# Patient Record
Sex: Female | Born: 1960 | Race: White | Hispanic: No | Marital: Married | Smoking: Current every day smoker
Health system: Southern US, Community
[De-identification: ages and names within clinical notes are randomized; demographics above are authoritative.]

## PROBLEM LIST (undated history)

## (undated) DIAGNOSIS — E785 Hyperlipidemia, unspecified: Secondary | ICD-10-CM

---

## 2004-10-10 ENCOUNTER — Emergency Department (HOSPITAL_COMMUNITY): Admission: EM | Admit: 2004-10-10 | Discharge: 2004-10-10 | Payer: Self-pay | Admitting: Family Medicine

## 2004-10-11 ENCOUNTER — Emergency Department (HOSPITAL_COMMUNITY): Admission: EM | Admit: 2004-10-11 | Discharge: 2004-10-11 | Payer: Self-pay | Admitting: Family Medicine

## 2004-10-13 ENCOUNTER — Ambulatory Visit: Payer: Self-pay | Admitting: Internal Medicine

## 2004-10-14 ENCOUNTER — Ambulatory Visit: Payer: Self-pay | Admitting: Cardiology

## 2007-01-10 ENCOUNTER — Observation Stay (HOSPITAL_COMMUNITY): Admission: EM | Admit: 2007-01-10 | Discharge: 2007-01-12 | Payer: Self-pay | Admitting: Emergency Medicine

## 2007-01-10 ENCOUNTER — Ambulatory Visit: Payer: Self-pay | Admitting: Internal Medicine

## 2008-06-30 ENCOUNTER — Emergency Department (HOSPITAL_COMMUNITY): Admission: EM | Admit: 2008-06-30 | Discharge: 2008-06-30 | Payer: Self-pay | Admitting: Emergency Medicine

## 2010-06-19 ENCOUNTER — Encounter: Payer: Self-pay | Admitting: Cardiology

## 2010-10-15 NOTE — Discharge Summary (Signed)
[name], [name]                 ACCOUNT NO.:  [id number]   MEDICAL RECORD NO.:  [id number]          PATIENT TYPE:  INP   LOCATION:  3705                         FACILITY:  [facility]   PHYSICIAN:  Hollace Hayward, M.D.   DATE OF BIRTH:  1961-01-06   DATE OF ADMISSION:  01/10/2007  DATE OF DISCHARGE:  01/12/2007                               DISCHARGE SUMMARY   DISCHARGE DIAGNOSES:  1. Atypical chest.  2. Dyslipidemia.   DISCHARGE MEDICATIONS:  Simcor which is Zocor and Niaspan and was  instructed to a 325 mg aspirin 1 hour prior to this medication, which  she was to take it each night.   DISPOSITION:  Home with self care.   PROCEDURE:  Patient received a Myoview stress test while inpatient.   CONSULTATIONS:  Patient saw Dr. Sharyn Lull of cardiology while she was an  inpatient and will plan to follow up with him as an outpatient.   HISTORY OF PRESENT ILLNESS:  [name] is a 50 year old woman with an  intermittent history of chest pain, who awoke this morning with pain,  sweats, shortness of breath which waxes and wanes.  It was less severe  in the morning and did abate some, but never completely resolved.  The  patient radiated to the scapula but not to the jaw or arm.  There is no  association with exertion or rest.  [name] has had similar pain  every once in a while, but has never had it as intense as it was  today.  She describes the pain as sharp and when it improves it is dull  and achy.  [name] awakens at night with night sweats regularly.  Of  note, she is status post hysterectomy without oophorectomy.  She has no  other significant past medical history, though she does not see doctor  regularly.  She indicates that she is under great stress at work and  wonders if this could be causing her chest pain.   PHYSICAL EXAMINATION:  VITAL SIGNS ON ADMISSION:  Temperature 98.0,  blood pressure 129/71, pulse 74-96, respirations 14-18, O2 saturation  99% on room air.  Weight  per patient report is 150 lb.  GENERAL:  In no acute distress.  HEENT:  Eyes - pupils, equal, round, reactive to light and  accommodation.  Extraocular movements intact.  ENT - uvula midline, poor  dentition.  NECK:  Supple.  No JVD.  RESPIRATIONS:  Clear to auscultation bilaterally without wheezes, rales  or crackles.  CV:  Regular rate and rhythm.  S1 and S2 heard with physiologic  splitting.  No murmurs, rubs or gallops.  GI:  Soft, nontender, no organomegaly.  EXTREMITIES:  No cyanosis, clubbing or edema.  GU:  Deferred.  SKIN:  No rashes or lesions noted.  LYMPHATICS:  No cervical, supraclavicular, or submandibular  lymphadenopathy.  MUSCULOSKELETAL:  No muscular atrophy.  NEURO:  Alert and oriented x3.  Cranial nerves II-XII intact.  PSYCH:  Affect appropriate.  Appears somewhat anxious.   LABORATORY ON ADMISSION:  Revealed a negative D-dimer, myoglobin 52, CK-  MB of  less than 1.  Troponin less than 0.05.  Her chemistries were 141,  3.7, chloride 108, CO2 22, BUN 12, creatinine 0.8 and glucose 94.   HOSPITAL COURSE:  [name] remained stable throughout the course of  her hospitalization, though she did continue to complain of some  intermittent chest pain, as well as intermittent back pain and headache.  She was seen by Dr. Sharyn Lull, her husband's cardiology, while an  inpatient and he was concerned of possible ischemia.  Therefore, she  underwent cardiac Myoview testing on Friday, August 15.  This showed a  normal ejection fraction of around 60% and no evidence of ischemia,  either past or present.  At that point, it was decided that she would be  able to go home.  However, a lipid panel had revealed dyslipidemia with  a low HDL and elevated LDL, so it was decided that she should be started  on Simcor, which is a combination drug of a statin and Niaspan, in order  to try to correct the dyslipidemia.   LABORATORY ON DISCHARGE:  She had no labs the day of discharge.   Previous labs as noted previously revealed an elevated LDL and low HDL.  Cardiac enzymes were negative times 3.   Her vital signs on the day of discharge are as follows:  T. max of 98.1.  Pulse ranged from 64-80.  Respirations were 18.  Systolic blood pressure  was from 87-105, diastolic from 54-66.  She saturated 96% on room air.   CONDITION:  Good.   FOLLOW UP:  She was discharged home for outpatient for modification of  cardiovascular risk factors.      Hollace Hayward, M.D.  Electronically Signed     TE/MEDQ  D:  01/14/2007  T:  01/14/2007  Job:  [id number]   cc:   Eduardo Osier. Sharyn Lull, M.D.

## 2011-03-11 LAB — HOMOCYSTEINE: Homocysteine: 9.9

## 2011-03-11 LAB — C-REACTIVE PROTEIN: CRP: 0.3 — ABNORMAL LOW (ref <0.6)

## 2011-03-14 LAB — RAPID URINE DRUG SCREEN, HOSP PERFORMED
Amphetamines: NOT DETECTED
Benzodiazepines: NOT DETECTED
Cocaine: NOT DETECTED
Opiates: NOT DETECTED
Tetrahydrocannabinol: NOT DETECTED

## 2011-03-14 LAB — LIPID PANEL
Cholesterol: 183
HDL: 29 — ABNORMAL LOW
Total CHOL/HDL Ratio: 6.3
Triglycerides: 90

## 2011-03-14 LAB — CBC
HCT: 39.4
Platelets: 295
WBC: 9

## 2011-03-14 LAB — DIFFERENTIAL
Basophils Absolute: 0
Basophils Relative: 1
Eosinophils Absolute: 0.2
Eosinophils Relative: 2
Lymphs Abs: 2.7
Neutrophils Relative %: 63

## 2011-03-14 LAB — I-STAT 8, (EC8 V) (CONVERTED LAB)
BUN: 12
Bicarbonate: 22.2
Glucose, Bld: 94
Potassium: 3.7
TCO2: 23
pH, Ven: 7.411 — ABNORMAL HIGH

## 2011-03-14 LAB — D-DIMER, QUANTITATIVE: D-Dimer, Quant: 0.22

## 2011-03-14 LAB — POCT CARDIAC MARKERS
CKMB, poc: <1 — ABNORMAL LOW
Myoglobin, poc: 52.3

## 2011-03-14 LAB — BASIC METABOLIC PANEL
BUN: 19
CO2: 26
Chloride: 107
Creatinine, Ser: 0.72
Potassium: 3.9

## 2011-03-14 LAB — CK TOTAL AND CKMB (NOT AT ARMC)
CK, MB: 1.4
Total CK: 65

## 2011-03-14 LAB — URINALYSIS, ROUTINE W REFLEX MICROSCOPIC
Bilirubin Urine: NEGATIVE
Glucose, UA: NEGATIVE
Hgb urine dipstick: NEGATIVE
Ketones, ur: NEGATIVE
Specific Gravity, Urine: 1.007
pH: 6.5

## 2011-03-14 LAB — HIV ANTIBODY (ROUTINE TESTING W REFLEX): HIV: NONREACTIVE

## 2011-03-14 LAB — LIPASE, BLOOD: Lipase: 16

## 2011-03-14 LAB — TSH: TSH: 1.087

## 2011-03-14 LAB — POCT I-STAT CREATININE: Creatinine, Ser: 0.8

## 2011-03-14 LAB — CARDIAC PANEL(CRET KIN+CKTOT+MB+TROPI)
CK, MB: 1.1
Relative Index: INVALID
Troponin I: 0.01
Troponin I: 0.02

## 2011-03-14 LAB — FOLLICLE STIMULATING HORMONE: FSH: 5.5

## 2015-09-27 ENCOUNTER — Ambulatory Visit (INDEPENDENT_AMBULATORY_CARE_PROVIDER_SITE_OTHER): Payer: BLUE CROSS/BLUE SHIELD | Admitting: Urgent Care

## 2015-09-27 VITALS — BP 132/78 | HR 78 | Temp 98.4°F | Resp 16 | Ht 61.0 in | Wt 182.0 lb

## 2015-09-27 DIAGNOSIS — R319 Hematuria, unspecified: Secondary | ICD-10-CM | POA: Diagnosis not present

## 2015-09-27 DIAGNOSIS — N3090 Cystitis, unspecified without hematuria: Secondary | ICD-10-CM

## 2015-09-27 DIAGNOSIS — R300 Dysuria: Secondary | ICD-10-CM

## 2015-09-27 LAB — POCT URINALYSIS DIP (MANUAL ENTRY)
BILIRUBIN UA: NEGATIVE
Bilirubin, UA: NEGATIVE
Glucose, UA: NEGATIVE
LEUKOCYTES UA: NEGATIVE
Nitrite, UA: NEGATIVE
PH UA: 5.5
PROTEIN UA: NEGATIVE
UROBILINOGEN UA: 0.2

## 2015-09-27 LAB — POC MICROSCOPIC URINALYSIS (UMFC): Mucus: ABSENT

## 2015-09-27 MED ORDER — CIPROFLOXACIN HCL 500 MG PO TABS: 500.0000 mg | ORAL_TABLET | Freq: Two times a day (BID) | ORAL | Status: DC

## 2015-09-27 MED ORDER — FLUCONAZOLE 150 MG PO TABS: 150.0000 mg | ORAL_TABLET | Freq: Once | ORAL | Status: DC

## 2015-09-27 NOTE — Progress Notes (Signed)
    MRN: [id number] DOB: [birth year]  Subjective:   [name] is a 55 y.o. female presenting for chief complaint of Dysuria and Hematuria  Reports 1 week history of intermittent dysuria, now worsened in the past day having worsening dysuria, hematuria, urinary urgency, urinary frequency, pelvic pain when she has to urinate. Has not tried any medications for relief. Denies fever, n/v, abdominal pain, flank pain, vaginal discharge, genital rashes. Patient is a smoker, does not drink alcohol.  [name] currently has no medications in their medication list. Also has No Known Allergies.  [name]  has no past medical history on file. Also  has no past surgical history on file.  Objective:   Vitals: BP 132/78 mmHg  Pulse 78  Temp(Src) 98.4 F (36.9 C)  Resp 16  Ht 5\' 1"  (1.549 m)  Wt 182 lb (82.555 kg)  BMI 34.41 kg/m2  SpO2 98%  LMP   Physical Exam  Constitutional: She is oriented to person, place, and time. She appears well-developed and well-nourished.  Cardiovascular: Normal rate, regular rhythm and intact distal pulses.  Exam reveals no gallop and no friction rub.   No murmur heard. Pulmonary/Chest: No respiratory distress. She has no wheezes. She has no rales.  Abdominal: Soft. Bowel sounds are normal. She exhibits no distension and no mass. There is no tenderness.  No CVA tenderness.  Neurological: She is alert and oriented to person, place, and time.  Skin: Skin is warm and dry.   Results for orders placed or performed in visit on 09/27/15 (from the past 24 hour(s))  POCT Microscopic Urinalysis ([facility])     Status: Abnormal   Collection Time: 09/27/15  9:06 AM  Result Value Ref Range   WBC,UR,HPF,POC Too numerous to count  (A) None WBC/hpf   RBC,UR,HPF,POC Moderate (A) None RBC/hpf   Bacteria Few (A) None, Too numerous to count   Mucus Absent Absent   Epithelial Cells, UR Per Microscopy Few (A) None, Too numerous to count cells/hpf  POCT urinalysis dipstick     Status: Abnormal     Collection Time: 09/27/15  9:06 AM  Result Value Ref Range   Color, UA yellow yellow   Clarity, UA cloudy (A) clear   Glucose, UA negative negative   Bilirubin, UA negative negative   Ketones, POC UA negative negative   Spec Grav, UA <=1.005    Blood, UA large (A) negative   pH, UA 5.5    Protein Ur, POC negative negative   Urobilinogen, UA 0.2    Nitrite, UA Negative Negative   Leukocytes, UA Negative Negative   Assessment and Plan :   1. Cystitis 2. Dysuria 3. Hematuria - Will cover for cystitis with ciprofloxacin, urine culture pending. Provided with scrit for diflucan given history of yeast infections. Advised aggressive hydration. RTC if no improvement in 1 week.  Wallis BambergMario Jarvis Sawa, PA-C [facility] [phone] 09/27/2015 8:59 AM

## 2015-09-27 NOTE — Patient Instructions (Signed)

## 2015-09-29 LAB — URINE CULTURE

## 2015-11-23 DIAGNOSIS — M25529 Pain in unspecified elbow: Secondary | ICD-10-CM | POA: Diagnosis not present

## 2015-11-23 DIAGNOSIS — S66919A Strain of unspecified muscle, fascia and tendon at wrist and hand level, unspecified hand, initial encounter: Secondary | ICD-10-CM | POA: Diagnosis not present

## 2015-11-23 DIAGNOSIS — R0781 Pleurodynia: Secondary | ICD-10-CM | POA: Diagnosis not present

## 2015-11-23 DIAGNOSIS — Z720 Tobacco use: Secondary | ICD-10-CM | POA: Diagnosis not present

## 2015-11-23 DIAGNOSIS — S8002XA Contusion of left knee, initial encounter: Secondary | ICD-10-CM | POA: Diagnosis not present

## 2017-04-22 DIAGNOSIS — J069 Acute upper respiratory infection, unspecified: Secondary | ICD-10-CM | POA: Diagnosis not present

## 2017-04-22 DIAGNOSIS — M25571 Pain in right ankle and joints of right foot: Secondary | ICD-10-CM | POA: Diagnosis not present

## 2017-04-27 DIAGNOSIS — M722 Plantar fascial fibromatosis: Secondary | ICD-10-CM | POA: Diagnosis not present

## 2017-04-27 DIAGNOSIS — M25571 Pain in right ankle and joints of right foot: Secondary | ICD-10-CM | POA: Diagnosis not present

## 2017-04-27 DIAGNOSIS — R252 Cramp and spasm: Secondary | ICD-10-CM | POA: Diagnosis not present

## 2017-06-29 DIAGNOSIS — F411 Generalized anxiety disorder: Secondary | ICD-10-CM | POA: Diagnosis not present

## 2017-06-29 DIAGNOSIS — F5101 Primary insomnia: Secondary | ICD-10-CM | POA: Diagnosis not present

## 2017-06-29 DIAGNOSIS — F330 Major depressive disorder, recurrent, mild: Secondary | ICD-10-CM | POA: Diagnosis not present

## 2017-06-29 DIAGNOSIS — E782 Mixed hyperlipidemia: Secondary | ICD-10-CM | POA: Diagnosis not present

## 2017-06-29 DIAGNOSIS — Z0000 Encounter for general adult medical examination without abnormal findings: Secondary | ICD-10-CM | POA: Diagnosis not present

## 2017-08-10 ENCOUNTER — Other Ambulatory Visit: Payer: Self-pay | Admitting: Family Medicine

## 2017-08-10 DIAGNOSIS — Z1231 Encounter for screening mammogram for malignant neoplasm of breast: Secondary | ICD-10-CM

## 2017-08-17 DIAGNOSIS — F5101 Primary insomnia: Secondary | ICD-10-CM | POA: Diagnosis not present

## 2017-08-17 DIAGNOSIS — F330 Major depressive disorder, recurrent, mild: Secondary | ICD-10-CM | POA: Diagnosis not present

## 2017-08-17 DIAGNOSIS — F411 Generalized anxiety disorder: Secondary | ICD-10-CM | POA: Diagnosis not present

## 2017-09-19 ENCOUNTER — Ambulatory Visit
Admission: RE | Admit: 2017-09-19 | Discharge: 2017-09-19 | Disposition: A | Discharge status: Home / Self Care | Payer: BLUE CROSS/BLUE SHIELD | Source: Ambulatory Visit | Attending: Family Medicine | Admitting: Family Medicine

## 2017-09-19 DIAGNOSIS — Z1231 Encounter for screening mammogram for malignant neoplasm of breast: Secondary | ICD-10-CM

## 2017-09-20 ENCOUNTER — Other Ambulatory Visit: Payer: Self-pay | Admitting: Family Medicine

## 2017-09-20 DIAGNOSIS — R928 Other abnormal and inconclusive findings on diagnostic imaging of breast: Secondary | ICD-10-CM

## 2017-09-25 ENCOUNTER — Ambulatory Visit
Admission: RE | Admit: 2017-09-25 | Discharge: 2017-09-25 | Disposition: A | Discharge status: Home / Self Care | Payer: BLUE CROSS/BLUE SHIELD | Source: Ambulatory Visit | Attending: Family Medicine | Admitting: Family Medicine

## 2017-09-25 ENCOUNTER — Other Ambulatory Visit: Payer: Self-pay | Admitting: Family Medicine

## 2017-09-25 DIAGNOSIS — R928 Other abnormal and inconclusive findings on diagnostic imaging of breast: Secondary | ICD-10-CM | POA: Diagnosis not present

## 2017-09-25 DIAGNOSIS — N651 Disproportion of reconstructed breast: Secondary | ICD-10-CM | POA: Diagnosis not present

## 2017-09-25 DIAGNOSIS — N6489 Other specified disorders of breast: Secondary | ICD-10-CM

## 2017-09-28 ENCOUNTER — Ambulatory Visit
Admission: RE | Admit: 2017-09-28 | Discharge: 2017-09-28 | Disposition: A | Discharge status: Home / Self Care | Payer: BLUE CROSS/BLUE SHIELD | Source: Ambulatory Visit | Attending: Family Medicine | Admitting: Family Medicine

## 2017-09-28 DIAGNOSIS — N6489 Other specified disorders of breast: Secondary | ICD-10-CM | POA: Diagnosis not present

## 2017-09-28 DIAGNOSIS — N6011 Diffuse cystic mastopathy of right breast: Secondary | ICD-10-CM | POA: Diagnosis not present

## 2017-09-29 DIAGNOSIS — F411 Generalized anxiety disorder: Secondary | ICD-10-CM | POA: Diagnosis not present

## 2017-09-29 DIAGNOSIS — F331 Major depressive disorder, recurrent, moderate: Secondary | ICD-10-CM | POA: Diagnosis not present

## 2017-09-29 DIAGNOSIS — F5101 Primary insomnia: Secondary | ICD-10-CM | POA: Diagnosis not present

## 2017-11-17 DIAGNOSIS — D126 Benign neoplasm of colon, unspecified: Secondary | ICD-10-CM | POA: Diagnosis not present

## 2017-11-17 DIAGNOSIS — Q438 Other specified congenital malformations of intestine: Secondary | ICD-10-CM | POA: Diagnosis not present

## 2017-11-17 DIAGNOSIS — K648 Other hemorrhoids: Secondary | ICD-10-CM | POA: Diagnosis not present

## 2017-11-17 DIAGNOSIS — K6289 Other specified diseases of anus and rectum: Secondary | ICD-10-CM | POA: Diagnosis not present

## 2017-11-17 DIAGNOSIS — Z1211 Encounter for screening for malignant neoplasm of colon: Secondary | ICD-10-CM | POA: Diagnosis not present

## 2017-11-21 DIAGNOSIS — Z1211 Encounter for screening for malignant neoplasm of colon: Secondary | ICD-10-CM | POA: Diagnosis not present

## 2017-11-21 DIAGNOSIS — D126 Benign neoplasm of colon, unspecified: Secondary | ICD-10-CM | POA: Diagnosis not present

## 2017-12-12 DIAGNOSIS — F330 Major depressive disorder, recurrent, mild: Secondary | ICD-10-CM | POA: Diagnosis not present

## 2017-12-12 DIAGNOSIS — M7989 Other specified soft tissue disorders: Secondary | ICD-10-CM | POA: Diagnosis not present

## 2017-12-12 DIAGNOSIS — F5101 Primary insomnia: Secondary | ICD-10-CM | POA: Diagnosis not present

## 2017-12-12 DIAGNOSIS — F411 Generalized anxiety disorder: Secondary | ICD-10-CM | POA: Diagnosis not present

## 2018-02-20 ENCOUNTER — Other Ambulatory Visit: Payer: Self-pay | Admitting: Family Medicine

## 2018-02-20 DIAGNOSIS — N6489 Other specified disorders of breast: Secondary | ICD-10-CM

## 2018-04-05 ENCOUNTER — Ambulatory Visit
Admission: RE | Admit: 2018-04-05 | Discharge: 2018-04-05 | Disposition: A | Discharge status: Home / Self Care | Payer: BLUE CROSS/BLUE SHIELD | Source: Ambulatory Visit | Attending: Family Medicine | Admitting: Family Medicine

## 2018-04-05 ENCOUNTER — Other Ambulatory Visit: Payer: Self-pay | Admitting: Family Medicine

## 2018-04-05 DIAGNOSIS — N6489 Other specified disorders of breast: Secondary | ICD-10-CM

## 2018-04-05 DIAGNOSIS — R928 Other abnormal and inconclusive findings on diagnostic imaging of breast: Secondary | ICD-10-CM | POA: Diagnosis not present

## 2018-04-16 DIAGNOSIS — N6019 Diffuse cystic mastopathy of unspecified breast: Secondary | ICD-10-CM | POA: Diagnosis not present

## 2018-09-05 ENCOUNTER — Other Ambulatory Visit: Payer: Self-pay | Admitting: Family Medicine

## 2018-09-05 DIAGNOSIS — Z1231 Encounter for screening mammogram for malignant neoplasm of breast: Secondary | ICD-10-CM

## 2018-09-17 DIAGNOSIS — F411 Generalized anxiety disorder: Secondary | ICD-10-CM | POA: Diagnosis not present

## 2018-09-17 DIAGNOSIS — F5101 Primary insomnia: Secondary | ICD-10-CM | POA: Diagnosis not present

## 2018-09-17 DIAGNOSIS — F331 Major depressive disorder, recurrent, moderate: Secondary | ICD-10-CM | POA: Diagnosis not present

## 2018-10-17 DIAGNOSIS — F411 Generalized anxiety disorder: Secondary | ICD-10-CM | POA: Diagnosis not present

## 2018-10-17 DIAGNOSIS — F5101 Primary insomnia: Secondary | ICD-10-CM | POA: Diagnosis not present

## 2018-10-17 DIAGNOSIS — F3341 Major depressive disorder, recurrent, in partial remission: Secondary | ICD-10-CM | POA: Diagnosis not present

## 2018-11-07 ENCOUNTER — Other Ambulatory Visit: Payer: Self-pay | Admitting: Family Medicine

## 2018-11-07 ENCOUNTER — Other Ambulatory Visit: Payer: Self-pay

## 2018-11-07 ENCOUNTER — Ambulatory Visit
Admission: RE | Admit: 2018-11-07 | Discharge: 2018-11-07 | Disposition: A | Discharge status: Home / Self Care | Payer: BC Managed Care – PPO | Source: Ambulatory Visit | Attending: Family Medicine | Admitting: Family Medicine

## 2018-11-07 DIAGNOSIS — N6489 Other specified disorders of breast: Secondary | ICD-10-CM

## 2018-11-07 DIAGNOSIS — Z1231 Encounter for screening mammogram for malignant neoplasm of breast: Secondary | ICD-10-CM

## 2018-11-16 ENCOUNTER — Ambulatory Visit
Admission: RE | Admit: 2018-11-16 | Discharge: 2018-11-16 | Disposition: A | Discharge status: Home / Self Care | Payer: BC Managed Care – PPO | Source: Ambulatory Visit | Attending: Family Medicine | Admitting: Family Medicine

## 2018-11-16 ENCOUNTER — Ambulatory Visit: Payer: BC Managed Care – PPO

## 2018-11-16 ENCOUNTER — Other Ambulatory Visit: Payer: Self-pay | Admitting: Family Medicine

## 2018-11-16 ENCOUNTER — Other Ambulatory Visit: Payer: Self-pay

## 2018-11-16 DIAGNOSIS — R928 Other abnormal and inconclusive findings on diagnostic imaging of breast: Secondary | ICD-10-CM | POA: Diagnosis not present

## 2018-11-16 DIAGNOSIS — N6489 Other specified disorders of breast: Secondary | ICD-10-CM

## 2018-11-20 ENCOUNTER — Ambulatory Visit
Admission: RE | Admit: 2018-11-20 | Discharge: 2018-11-20 | Disposition: A | Discharge status: Home / Self Care | Payer: BC Managed Care – PPO | Source: Ambulatory Visit | Attending: Family Medicine | Admitting: Family Medicine

## 2018-11-20 ENCOUNTER — Other Ambulatory Visit: Payer: Self-pay

## 2018-11-20 DIAGNOSIS — N6489 Other specified disorders of breast: Secondary | ICD-10-CM

## 2018-11-20 DIAGNOSIS — D241 Benign neoplasm of right breast: Secondary | ICD-10-CM | POA: Diagnosis not present

## 2018-11-20 DIAGNOSIS — R928 Other abnormal and inconclusive findings on diagnostic imaging of breast: Secondary | ICD-10-CM | POA: Diagnosis not present

## 2018-11-26 DIAGNOSIS — L905 Scar conditions and fibrosis of skin: Secondary | ICD-10-CM | POA: Diagnosis not present

## 2019-01-10 DIAGNOSIS — Z0000 Encounter for general adult medical examination without abnormal findings: Secondary | ICD-10-CM | POA: Diagnosis not present

## 2019-04-30 DIAGNOSIS — Z7189 Other specified counseling: Secondary | ICD-10-CM | POA: Diagnosis not present

## 2019-04-30 DIAGNOSIS — Z20828 Contact with and (suspected) exposure to other viral communicable diseases: Secondary | ICD-10-CM | POA: Diagnosis not present

## 2020-04-30 DIAGNOSIS — F5101 Primary insomnia: Secondary | ICD-10-CM | POA: Diagnosis not present

## 2020-04-30 DIAGNOSIS — F3341 Major depressive disorder, recurrent, in partial remission: Secondary | ICD-10-CM | POA: Diagnosis not present

## 2020-04-30 DIAGNOSIS — M7989 Other specified soft tissue disorders: Secondary | ICD-10-CM | POA: Diagnosis not present

## 2020-04-30 DIAGNOSIS — F411 Generalized anxiety disorder: Secondary | ICD-10-CM | POA: Diagnosis not present

## 2020-06-03 DIAGNOSIS — F3341 Major depressive disorder, recurrent, in partial remission: Secondary | ICD-10-CM | POA: Diagnosis not present

## 2020-06-03 DIAGNOSIS — M7989 Other specified soft tissue disorders: Secondary | ICD-10-CM | POA: Diagnosis not present

## 2020-06-03 DIAGNOSIS — F5101 Primary insomnia: Secondary | ICD-10-CM | POA: Diagnosis not present

## 2020-06-22 IMAGING — MG DIGITAL DIAGNOSTIC UNILATERAL RIGHT MAMMOGRAM WITH TOMO AND CAD
8 series · 8 of 24 positions shown · non-contrast
Comparison: Previous exam(s).

Addendum:
CLINICAL DATA: Patient presents for diagnostic right breast
examination as follow-up to a benign stereotactic core needle biopsy
September 2017 demonstrating fibrocystic change with adenosis and usual
ductal hyperplasia over an area of questionable distortion.

EXAM:
DIGITAL DIAGNOSTIC UNILATERAL RIGHT MAMMOGRAM WITH CAD AND TOMO

[R CC synth-2D (1 of 2)]
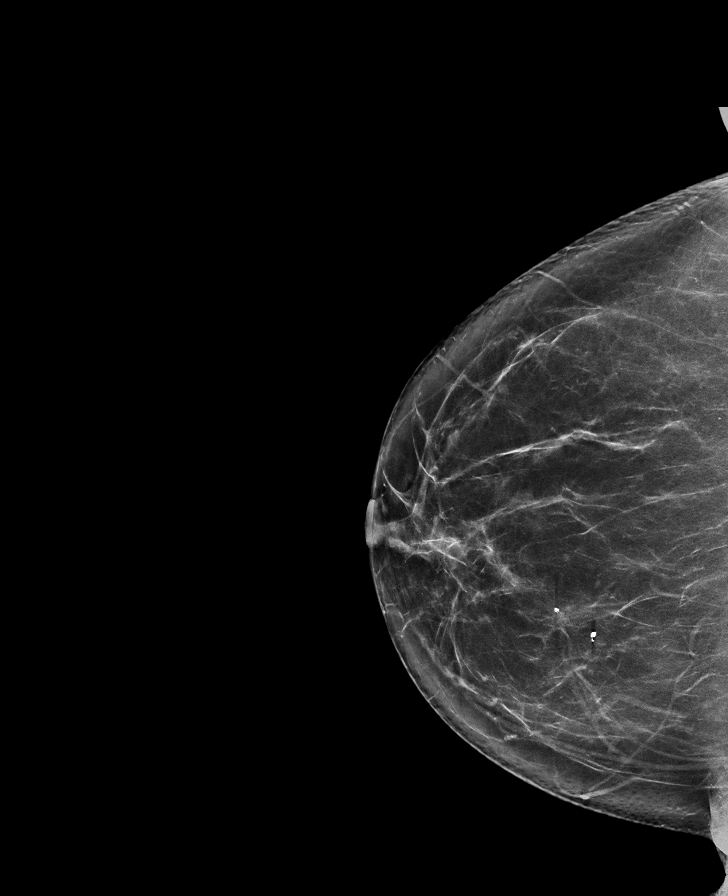

[R MLO synth-2D (1 of 2)]
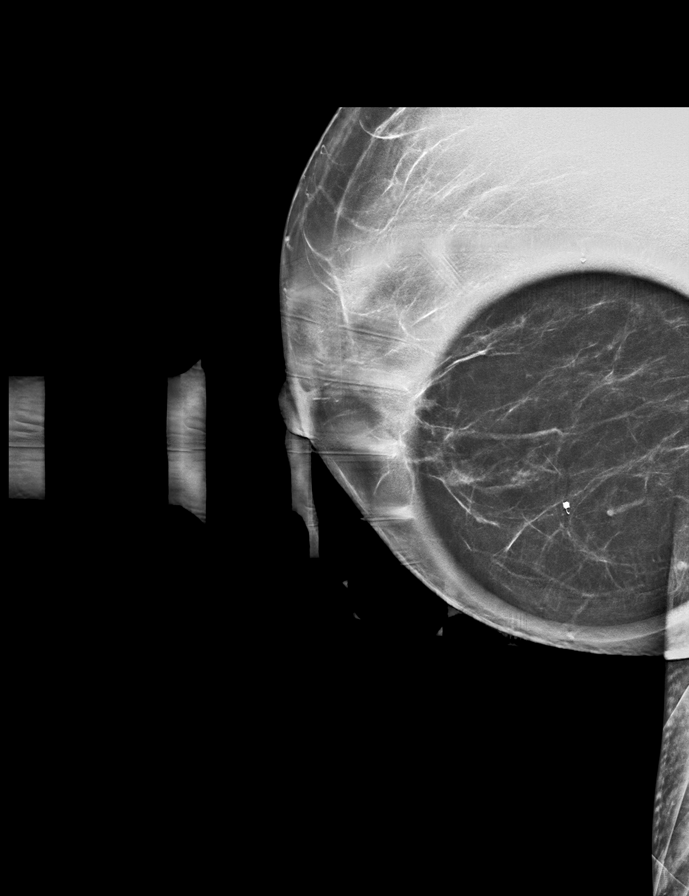

[R CC synth-2D (2 of 2)]
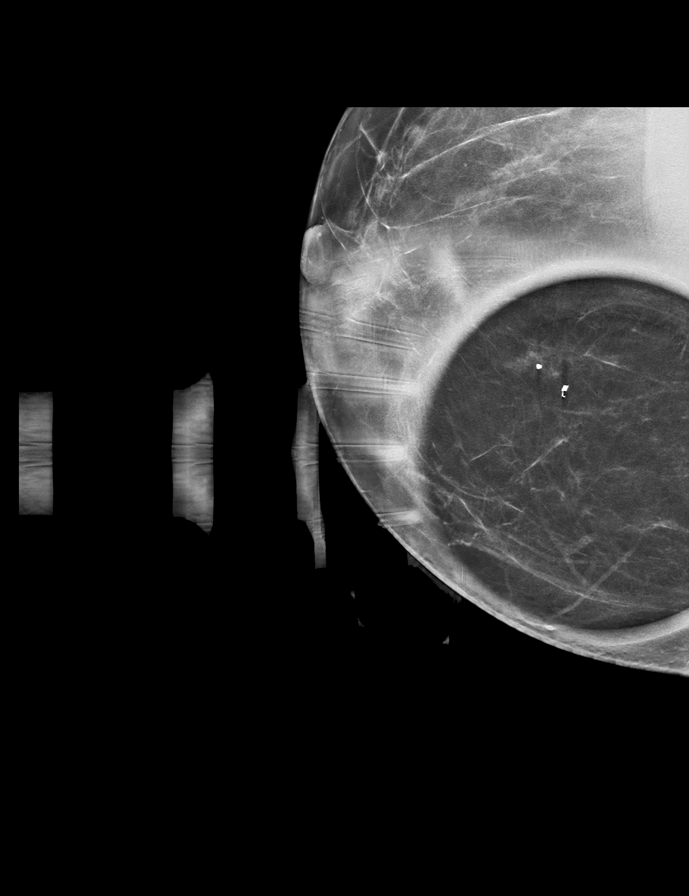

[R MLO synth-2D (2 of 2)]
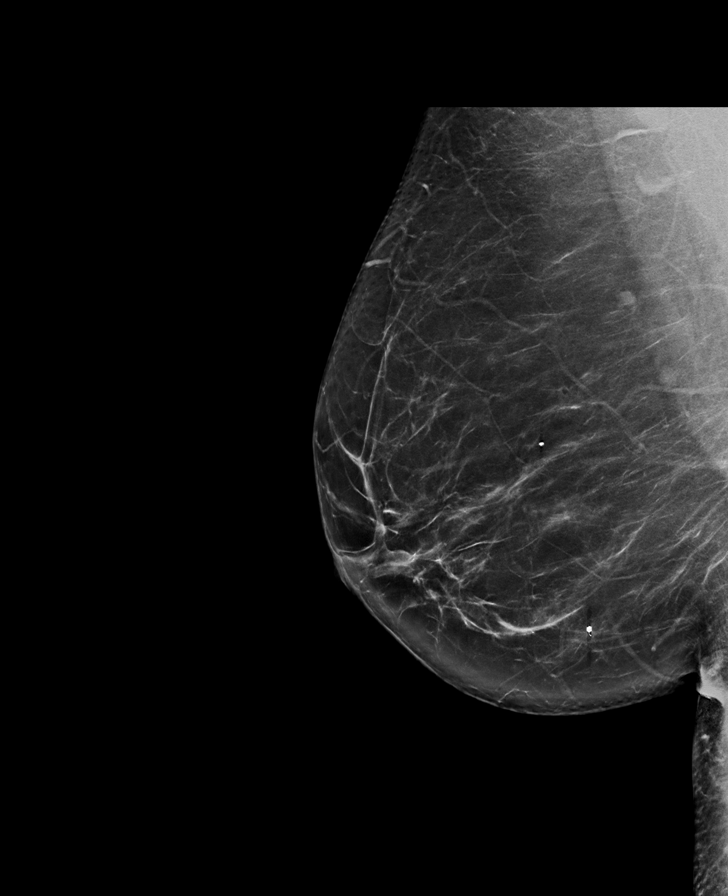

[R CC tomo (1 of 2) · tomo slice 31/62.0]
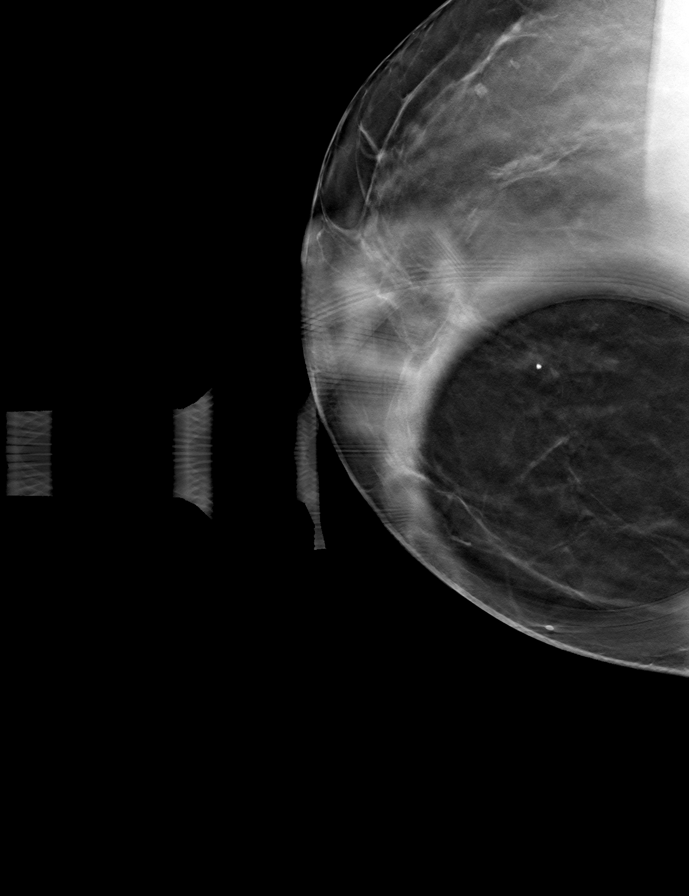

[R MLO tomo (1 of 2) · tomo slice 33/65.0]
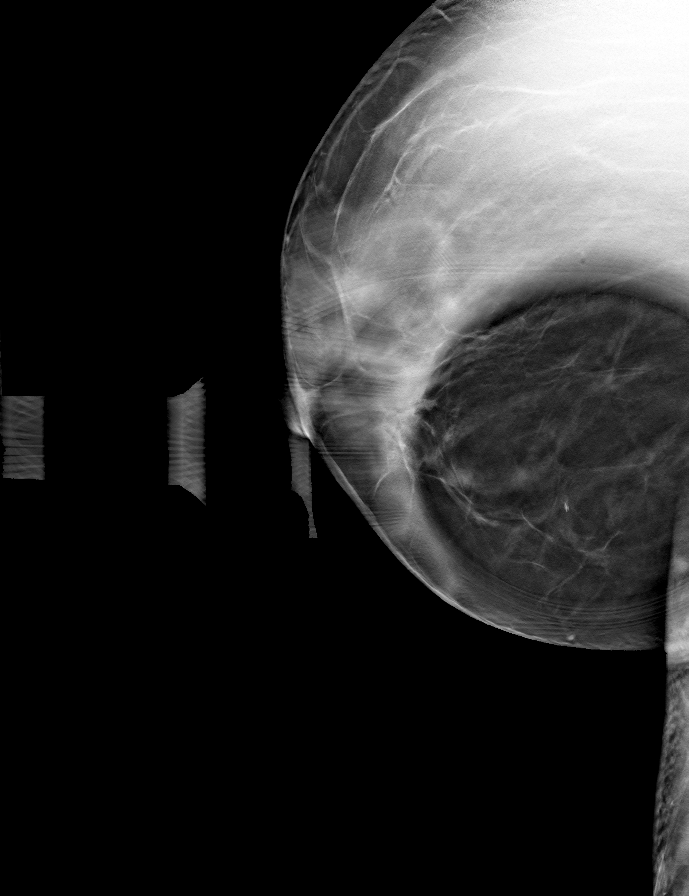

[R CC tomo (2 of 2) · tomo slice 40/79.0]
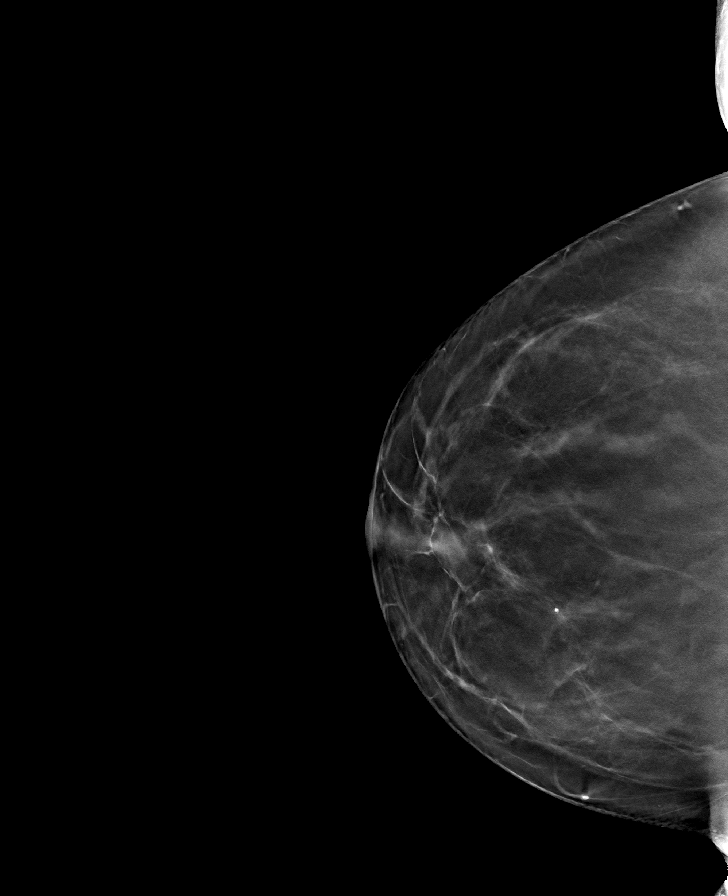

[R MLO tomo (2 of 2) · tomo slice 43/86.0]
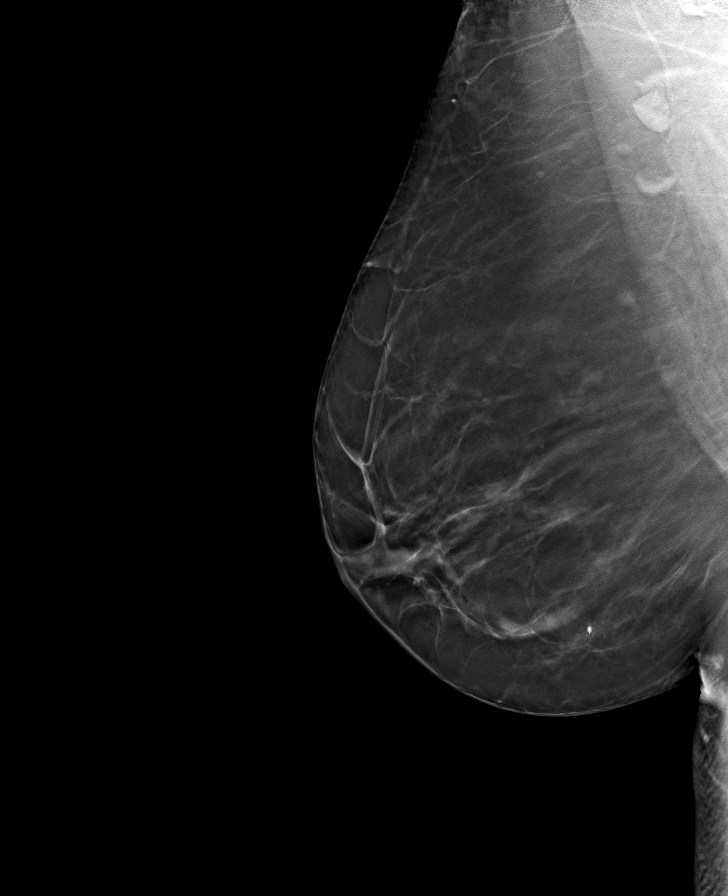

[8 of 24 positions shown; findings below may reference images not displayed]

ACR Breast Density Category b: There are scattered areas of
fibroglandular density.
FINDINGS: Examination demonstrates patient's biopsy clip 7 mm posteromedial to
the questionable distortion on the CC image and 9 mm posterior
inferior to the questionable distortion on the MLO view. The area of
questionable distortion is unchanged. No other new findings.

Mammographic images were processed with CAD.
IMPRESSION: Stable area of questionable distortion over the inner lower left
breast with adjacent post biopsy clip as described.

RECOMMENDATION:
Management options were discussed including continued imaging
surveillance versus excisional biopsy. Patient agrees to proceed
with excisional biopsy at this time.

I have discussed the findings and recommendations with the patient.
Results were also provided in writing at the conclusion of the
visit. If applicable, a reminder letter will be sent to the patient
regarding the next appointment.

BI-RADS CATEGORY  2: Benign.

Patient will be contacted by a nurse navigator here at the [REDACTED] regarding surgical referral.

ADDENDUM:
Surgical consultation has been arranged with Dr. Blade Aujla at
[REDACTED] on April 16, 2018.

Pathology results reported by Patrycja Heil, RN on 04/06/2018.

*** End of Addendum ***

## 2020-12-23 DIAGNOSIS — M79671 Pain in right foot: Secondary | ICD-10-CM | POA: Diagnosis not present

## 2020-12-23 DIAGNOSIS — F5101 Primary insomnia: Secondary | ICD-10-CM | POA: Diagnosis not present

## 2020-12-23 DIAGNOSIS — Z0000 Encounter for general adult medical examination without abnormal findings: Secondary | ICD-10-CM | POA: Diagnosis not present

## 2020-12-23 DIAGNOSIS — E782 Mixed hyperlipidemia: Secondary | ICD-10-CM | POA: Diagnosis not present

## 2020-12-23 DIAGNOSIS — R7301 Impaired fasting glucose: Secondary | ICD-10-CM | POA: Diagnosis not present

## 2021-01-30 DIAGNOSIS — Z720 Tobacco use: Secondary | ICD-10-CM | POA: Diagnosis not present

## 2021-03-25 ENCOUNTER — Ambulatory Visit
Admission: RE | Admit: 2021-03-25 | Discharge: 2021-03-25 | Disposition: A | Discharge status: Home / Self Care | Payer: BC Managed Care – PPO | Source: Ambulatory Visit

## 2021-03-25 ENCOUNTER — Other Ambulatory Visit: Payer: Self-pay

## 2021-03-25 VITALS — BP 138/80 | HR 77 | Temp 98.6°F | Resp 14

## 2021-03-25 DIAGNOSIS — J0120 Acute ethmoidal sinusitis, unspecified: Secondary | ICD-10-CM

## 2021-03-25 HISTORY — DX: Hyperlipidemia, unspecified: E78.5

## 2021-03-25 MED ORDER — AMOXICILLIN-POT CLAVULANATE 875-125 MG PO TABS: 1.0000 | ORAL_TABLET | Freq: Two times a day (BID) | ORAL | 0 refills | Status: AC

## 2021-03-25 NOTE — Discharge Instructions (Signed)
Return if any problems.

## 2021-03-25 NOTE — ED Triage Notes (Signed)
Patient c/o bilateral ear pain and sinus pressure x 6 days.   Patient endorses headache upon waking this morning.   Patient endorses productive cough.   Patient 2 at home COVID test with negative results.   Patient has taken "allergy medicine and ibuprofen " with no relief of symptoms.

## 2021-03-28 NOTE — ED Provider Notes (Signed)
[facility]    CSN: [id number] Arrival date & time: 03/25/21  1253      History   Chief Complaint Chief Complaint  Patient presents with   Otalgia   Facial Pain   APPT 1300    HPI [name] is a 60 y.o. female.   The history is provided by the patient. No language interpreter was used.  Otalgia Location:  Bilateral Behind ear:  No abnormality Quality:  Aching Severity:  Moderate Onset quality:  Gradual Timing:  Constant Progression:  Worsening Chronicity:  New Relieved by:  Nothing Ineffective treatments:  None tried Associated symptoms: congestion and rhinorrhea   Associated symptoms: no abdominal pain   Risk factors: no recent travel    Past Medical History:  Diagnosis Date   Hyperlipidemia     There are no problems to display for this patient.   History reviewed. No pertinent surgical history.  OB History   No obstetric history on file.      Home Medications    Prior to Admission medications   Medication Sig Start Date End Date Taking? Authorizing Provider  amoxicillin-clavulanate (AUGMENTIN) 875-125 MG tablet Take 1 tablet by mouth 2 (two) times daily for 10 days. 03/25/21 04/04/21 Yes Cheron Schaumann K, PA-C  atorvastatin (LIPITOR) 10 MG tablet Take 10 mg by mouth daily. 12/30/20  Yes [provider]  furosemide (LASIX) 20 MG tablet Take 20 mg by mouth daily as needed. 03/11/21   [provider]  traZODone (DESYREL) 50 MG tablet Take 50 mg by mouth at bedtime. 03/12/21   [provider]    Family History History reviewed. No pertinent family history.  Social History Social History   Tobacco Use   Smoking status: Every Day    Packs/day: 0.25    Types: Cigarettes   Smokeless tobacco: Never     Allergies   Patient has no known allergies.   Review of Systems Review of Systems  HENT:  Positive for congestion, ear pain and rhinorrhea.   Gastrointestinal:  Negative for abdominal pain.  All  other systems reviewed and are negative.   Physical Exam Triage Vital Signs ED Triage Vitals [03/25/21 1320]  Enc Vitals Group     BP 138/80     Pulse Rate 77     Resp 14     Temp 98.6 F (37 C)     Temp Source Oral     SpO2 95 %     Weight      Height      Head Circumference      Peak Flow      Pain Score 7     Pain Loc      Pain Edu?      Excl. in GC?    No data found.  Updated Vital Signs BP 138/80 (BP Location: Right Arm)   Pulse 77   Temp 98.6 F (37 C) (Oral)   Resp 14   SpO2 95%   Visual Acuity Right Eye Distance:   Left Eye Distance:   Bilateral Distance:    Right Eye Near:   Left Eye Near:    Bilateral Near:     Physical Exam Vitals and nursing note reviewed.  Constitutional:      Appearance: She is well-developed.  HENT:     Head: Normocephalic.     Right Ear: Tympanic membrane normal.     Left Ear: Tympanic membrane normal.     Mouth/Throat:  Mouth: Mucous membranes are moist.  Eyes:     Pupils: Pupils are equal, round, and reactive to light.  Cardiovascular:     Rate and Rhythm: Normal rate.  Pulmonary:     Effort: Pulmonary effort is normal.  Abdominal:     General: There is no distension.  Musculoskeletal:        General: Normal range of motion.     Cervical back: Normal range of motion.  Skin:    General: Skin is warm.  Neurological:     Mental Status: She is alert and oriented to person, place, and time.     UC Treatments / Results  Labs (all labs ordered are listed, but only abnormal results are displayed) Labs Reviewed - No data to display  EKG   Radiology No results found.  Procedures Procedures (including critical care time)  Medications Ordered in UC Medications - No data to display  Initial Impression / Assessment and Plan / UC Course  I have reviewed the triage vital signs and the nursing notes.  Pertinent labs & imaging results that were available during my care of the patient were reviewed by me and  considered in my medical decision making (see chart for details).     MDM:  Pt counseled on sinus infections   Final Clinical Impressions(s) / UC Diagnoses   Final diagnoses:  Acute ethmoidal sinusitis, recurrence not specified     Discharge Instructions      Return if any problems.    ED Prescriptions     Medication Sig Dispense Auth. Provider   amoxicillin-clavulanate (AUGMENTIN) 875-125 MG tablet Take 1 tablet by mouth 2 (two) times daily for 10 days. 20 tablet Elson Areas, [place]      PDMP not reviewed this encounter.   Elson Areas, [place] 03/28/21 [phone]

## 2021-12-12 ENCOUNTER — Ambulatory Visit
Admission: EM | Admit: 2021-12-12 | Discharge: 2021-12-12 | Disposition: A | Discharge status: Home / Self Care | Payer: BC Managed Care – PPO | Attending: Family Medicine | Admitting: Family Medicine

## 2021-12-12 ENCOUNTER — Encounter: Payer: Self-pay | Admitting: Emergency Medicine

## 2021-12-12 DIAGNOSIS — J069 Acute upper respiratory infection, unspecified: Secondary | ICD-10-CM | POA: Diagnosis not present

## 2021-12-12 MED ORDER — ALBUTEROL SULFATE HFA 108 (90 BASE) MCG/ACT IN AERS: 1.0000 | INHALATION_SPRAY | Freq: Four times a day (QID) | RESPIRATORY_TRACT | 0 refills | Status: DC | PRN

## 2021-12-12 MED ORDER — PROMETHAZINE-DM 6.25-15 MG/5ML PO SYRP: 5.0000 mL | ORAL_SOLUTION | Freq: Four times a day (QID) | ORAL | 0 refills | Status: DC | PRN

## 2021-12-12 NOTE — ED Provider Notes (Signed)
[facility]    CSN: [id number] Arrival date & time: 12/12/21  1038      History   Chief Complaint Chief Complaint  Patient presents with   Appointment   Cough    HPI [name] is a 61 y.o. female.   Presenting today with 2-day history of productive cough, chest congestion, nasal congestion, sore throat, fatigue.  Denies fever, chills, chest pain, shortness of breath, abdominal pain, nausea vomiting or diarrhea.  So far not trying anything over-the-counter for symptoms.  History of childhood asthma, otherwise no known chronic pulmonary disease.  No known sick contacts recently.    Past Medical History:  Diagnosis Date   Hyperlipidemia     There are no problems to display for this patient.   History reviewed. No pertinent surgical history.  OB History   No obstetric history on file.      Home Medications    Prior to Admission medications   Medication Sig Start Date End Date Taking? Authorizing Provider  albuterol (VENTOLIN HFA) 108 (90 Base) MCG/ACT inhaler Inhale 1-2 puffs into the lungs every 6 (six) hours as needed for wheezing or shortness of breath. 12/12/21  Yes Particia Nearing, PA-C  atorvastatin (LIPITOR) 10 MG tablet Take 10 mg by mouth daily. 12/30/20  Yes [provider]  furosemide (LASIX) 20 MG tablet Take 20 mg by mouth daily as needed. 03/11/21  Yes [provider]  promethazine-dextromethorphan (PROMETHAZINE-DM) 6.25-15 MG/5ML syrup Take 5 mLs by mouth 4 (four) times daily as needed. 12/12/21  Yes Particia Nearing, PA-C  traZODone (DESYREL) 50 MG tablet Take 50 mg by mouth at bedtime. 03/12/21  Yes [provider]    Family History History reviewed. No pertinent family history.  Social History Social History   Tobacco Use   Smoking status: Every Day    Packs/day: 0.25    Types: Cigarettes   Smokeless tobacco: Never  Substance Use Topics   Alcohol use: Never   Drug use: Never      Allergies   Bupropion hcl er (xl) and Other   Review of Systems Review of Systems Per HPI  Physical Exam Triage Vital Signs ED Triage Vitals  Enc Vitals Group     BP 12/12/21 1045 128/83     Pulse Rate 12/12/21 1045 72     Resp 12/12/21 1045 18     Temp 12/12/21 1045 98.6 F (37 C)     Temp Source 12/12/21 1045 Oral     SpO2 12/12/21 1045 94 %     Weight 12/12/21 1047 180 lb (81.6 kg)     Height 12/12/21 1047 5' (1.524 m)     Head Circumference --      Peak Flow --      Pain Score 12/12/21 1047 0     Pain Loc --      Pain Edu? --      Excl. in GC? --    No data found.  Updated Vital Signs BP 128/83 (BP Location: Right Arm)   Pulse 72   Temp 98.6 F (37 C) (Oral)   Resp 18   Ht 5' (1.524 m)   Wt 180 lb (81.6 kg)   SpO2 94%   BMI 35.15 kg/m   Visual Acuity Right Eye Distance:   Left Eye Distance:   Bilateral Distance:    Right Eye Near:   Left Eye Near:    Bilateral Near:     Physical Exam Vitals and  nursing note reviewed.  Constitutional:      Appearance: Normal appearance. She is not ill-appearing.  HENT:     Head: Atraumatic.     Right Ear: Tympanic membrane normal.     Left Ear: Tympanic membrane normal.     Nose: Rhinorrhea present.     Mouth/Throat:     Mouth: Mucous membranes are moist.     Pharynx: Posterior oropharyngeal erythema present. No oropharyngeal exudate.  Eyes:     Extraocular Movements: Extraocular movements intact.     Conjunctiva/sclera: Conjunctivae normal.  Cardiovascular:     Rate and Rhythm: Normal rate and regular rhythm.     Heart sounds: Normal heart sounds.  Pulmonary:     Effort: Pulmonary effort is normal.     Breath sounds: Normal breath sounds. No wheezing or rales.  Musculoskeletal:        General: Normal range of motion.     Cervical back: Normal range of motion and neck supple.  Skin:    General: Skin is warm and dry.  Neurological:     Mental Status: She is alert and oriented to person, place,  and time.  Psychiatric:        Mood and Affect: Mood normal.        Thought Content: Thought content normal.        Judgment: Judgment normal.    UC Treatments / Results  Labs (all labs ordered are listed, but only abnormal results are displayed) Labs Reviewed - No data to display  EKG   Radiology No results found.  Procedures Procedures (including critical care time)  Medications Ordered in UC Medications - No data to display  Initial Impression / Assessment and Plan / UC Course  I have reviewed the triage vital signs and the nursing notes.  Pertinent labs & imaging results that were available during my care of the patient were reviewed by me and considered in my medical decision making (see chart for details).     Viral upper respiratory infection, discussed Phenergan DM, butyryl inhaler, supportive over-the-counter medications and home care.  Declines viral testing.  Return for worsening symptoms.  Final Clinical Impressions(s) / UC Diagnoses   Final diagnoses:  Viral URI with cough   Discharge Instructions   None    ED Prescriptions     Medication Sig Dispense Auth. Provider   promethazine-dextromethorphan (PROMETHAZINE-DM) 6.25-15 MG/5ML syrup Take 5 mLs by mouth 4 (four) times daily as needed. 100 mL Particia Nearing, PA-C   albuterol (VENTOLIN HFA) 108 (90 Base) MCG/ACT inhaler Inhale 1-2 puffs into the lungs every 6 (six) hours as needed for wheezing or shortness of breath. 18 g Particia Nearing, [place]      PDMP not reviewed this encounter.   Particia Nearing, [place] 12/12/21 1114

## 2021-12-12 NOTE — ED Triage Notes (Signed)
Patient c/o productive cough, chest congestion, nasal drainage x 2 days.  Patient has taken Ibuprofen.

## 2021-12-18 ENCOUNTER — Ambulatory Visit
Admission: RE | Admit: 2021-12-18 | Discharge: 2021-12-18 | Disposition: A | Discharge status: Home / Self Care | Payer: BC Managed Care – PPO | Source: Ambulatory Visit | Attending: Nurse Practitioner | Admitting: Nurse Practitioner

## 2021-12-18 VITALS — BP 143/86 | HR 76 | Temp 98.1°F | Resp 18

## 2021-12-18 DIAGNOSIS — J0100 Acute maxillary sinusitis, unspecified: Secondary | ICD-10-CM

## 2021-12-18 MED ORDER — FLUTICASONE PROPIONATE 50 MCG/ACT NA SUSP: 2.0000 | Freq: Every day | NASAL | 0 refills | Status: DC

## 2021-12-18 MED ORDER — AMOXICILLIN-POT CLAVULANATE 875-125 MG PO TABS: 1.0000 | ORAL_TABLET | Freq: Two times a day (BID) | ORAL | 0 refills | Status: DC

## 2021-12-18 MED ORDER — FLUCONAZOLE 150 MG PO TABS: 150.0000 mg | ORAL_TABLET | Freq: Once | ORAL | 0 refills | Status: AC

## 2021-12-18 NOTE — ED Triage Notes (Signed)
Pt reports cough x 1 1/2 week; right ear pain x 1 week.  Reports cough is not getting better.

## 2021-12-18 NOTE — ED Provider Notes (Signed)
[facility]    CSN: [id number] Arrival date & time: 12/18/21  1048      History   Chief Complaint Chief Complaint  Patient presents with   Cough    Entered by patient   Appointment    1100    HPI [name] is a 61 y.o. female.   The history is provided by the patient.   Patient presents for complaints of cough that has been present for the past 10 days.  She states she was seen on 12/12/2021 and given a prescription for an albuterol inhaler and Promethazine DM.  She states over the last 24 to 48 hours, her cough has become productive of green sputum, and she has right ear pain..  She has also blowing out greenish-yellow mucus.  She denies fever, chills, headache, ear drainage, wheezing, shortness of breath, or GI symptoms.  She states that "I feel terrible".  Past Medical History:  Diagnosis Date   Hyperlipidemia     There are no problems to display for this patient.   History reviewed. No pertinent surgical history.  OB History   No obstetric history on file.      Home Medications    Prior to Admission medications   Medication Sig Start Date End Date Taking? Authorizing Provider  amoxicillin-clavulanate (AUGMENTIN) 875-125 MG tablet Take 1 tablet by mouth every 12 (twelve) hours. 12/18/21  Yes Lundyn Coste-Warren, Sadie Haber, NP  fluticasone (FLONASE) 50 MCG/ACT nasal spray Place 2 sprays into both nostrils daily. 12/18/21  Yes Dewain Platz-Warren, Sadie Haber, NP  albuterol (VENTOLIN HFA) 108 (90 Base) MCG/ACT inhaler Inhale 1-2 puffs into the lungs every 6 (six) hours as needed for wheezing or shortness of breath. 12/12/21   Particia Nearing, PA-C  atorvastatin (LIPITOR) 10 MG tablet Take 10 mg by mouth daily. 12/30/20   [provider]  furosemide (LASIX) 20 MG tablet Take 20 mg by mouth daily as needed. 03/11/21   [provider]  promethazine-dextromethorphan (PROMETHAZINE-DM) 6.25-15 MG/5ML syrup Take 5 mLs by mouth 4 (four) times daily  as needed. 12/12/21   Particia Nearing, PA-C  traZODone (DESYREL) 50 MG tablet Take 50 mg by mouth at bedtime. 03/12/21   [provider]    Family History History reviewed. No pertinent family history.  Social History Social History   Tobacco Use   Smoking status: Every Day    Packs/day: 0.25    Types: Cigarettes   Smokeless tobacco: Never  Substance Use Topics   Alcohol use: Never   Drug use: Never     Allergies   Bupropion hcl er (xl) and Other   Review of Systems Review of Systems Per HPI  Physical Exam Triage Vital Signs ED Triage Vitals  Enc Vitals Group     BP 12/18/21 1112 (!) 143/86     Pulse Rate 12/18/21 1112 76     Resp 12/18/21 1112 18     Temp 12/18/21 1112 98.1 F (36.7 C)     Temp Source 12/18/21 1112 Oral     SpO2 12/18/21 1112 95 %     Weight --      Height --      Head Circumference --      Peak Flow --      Pain Score 12/18/21 1115 0     Pain Loc --      Pain Edu? --      Excl. in GC? --    No data found.  Updated  Vital Signs BP (!) 143/86 (BP Location: Right Arm)   Pulse 76   Temp 98.1 F (36.7 C) (Oral)   Resp 18   SpO2 95%   Visual Acuity Right Eye Distance:   Left Eye Distance:   Bilateral Distance:    Right Eye Near:   Left Eye Near:    Bilateral Near:     Physical Exam Vitals and nursing note reviewed.  Constitutional:      General: She is not in acute distress.    Appearance: Normal appearance.  HENT:     Head: Normocephalic.     Right Ear: Tympanic membrane, ear canal and external ear normal.     Left Ear: Tympanic membrane, ear canal and external ear normal.     Nose: Congestion and rhinorrhea present.     Right Turbinates: Enlarged and swollen.     Left Turbinates: Enlarged and swollen.     Right Sinus: Maxillary sinus tenderness present. No frontal sinus tenderness.     Left Sinus: Maxillary sinus tenderness present. No frontal sinus tenderness.     Mouth/Throat:     Mouth: Mucous membranes  are moist.     Pharynx: Uvula midline. Posterior oropharyngeal erythema present. No pharyngeal swelling.     Tonsils: 0 on the right. 0 on the left.  Eyes:     Extraocular Movements: Extraocular movements intact.     Conjunctiva/sclera: Conjunctivae normal.     Pupils: Pupils are equal, round, and reactive to light.  Cardiovascular:     Rate and Rhythm: Normal rate and regular rhythm.     Pulses: Normal pulses.     Heart sounds: Normal heart sounds.  Pulmonary:     Effort: Pulmonary effort is normal. No respiratory distress.     Breath sounds: Normal breath sounds. No stridor. No wheezing, rhonchi or rales.  Abdominal:     General: Bowel sounds are normal.     Palpations: Abdomen is soft.  Musculoskeletal:     Cervical back: Normal range of motion.  Lymphadenopathy:     Cervical: No cervical adenopathy.  Skin:    General: Skin is warm and dry.  Neurological:     General: No focal deficit present.     Mental Status: She is alert and oriented to person, place, and time.  Psychiatric:        Mood and Affect: Mood normal.        Behavior: Behavior normal.      UC Treatments / Results  Labs (all labs ordered are listed, but only abnormal results are displayed) Labs Reviewed - No data to display  EKG   Radiology No results found.  Procedures Procedures (including critical care time)  Medications Ordered in UC Medications - No data to display  Initial Impression / Assessment and Plan / UC Course  I have reviewed the triage vital signs and the nursing notes.  Pertinent labs & imaging results that were available during my care of the patient were reviewed by me and considered in my medical decision making (see chart for details).  Patient presents for continued upper respiratory symptoms.  Patient was seen in this clinic on 12/12/2021 and diagnosed with a viral upper respiratory infection.  Symptoms have worsened over the past 24 to 48 hours, she reports no relief of her  symptoms.  On exam, she has maxillary sinus tenderness, she also has mucopurulent drainage and sputum production.  Symptoms are consistent with a bacterial sinusitis.  We will start patient on  Augmentin and fluticasone.  Patient was advised to continue the Promethazine DM and use of the albuterol inhaler as needed.  Supportive care recommendations were provided to the patient.  Patient advised to follow-up in this clinic or with her PCP if symptoms do not improve. Final Clinical Impressions(s) / UC Diagnoses   Final diagnoses:  Acute maxillary sinusitis, recurrence not specified     Discharge Instructions      Take medication as prescribed.  Continue to take the cough medicine and use your albuterol inhaler that was previously prescribed. Increase fluids and get plenty of rest. May take over-the-counter ibuprofen or Tylenol as needed for pain, fever, or general discomfort. Recommend normal saline nasal spray to help with nasal congestion throughout the day. For your cough, it may be helpful to use a humidifier at bedtime during sleep. If your symptoms fail to improve within the next 7 to 10 days, please follow-up in our clinic or with your primary care physician..      ED Prescriptions     Medication Sig Dispense Auth. Provider   amoxicillin-clavulanate (AUGMENTIN) 875-125 MG tablet Take 1 tablet by mouth every 12 (twelve) hours. 14 tablet Lexus Barletta-Warren, Sadie Haber, NP   fluticasone (FLONASE) 50 MCG/ACT nasal spray Place 2 sprays into both nostrils daily. 16 g Sho Salguero-Warren, Sadie Haber, NP      PDMP not reviewed this encounter.   Abran Cantor, NP 12/18/21 1156

## 2021-12-18 NOTE — Discharge Instructions (Signed)
Take medication as prescribed.  Continue to take the cough medicine and use your albuterol inhaler that was previously prescribed. Increase fluids and get plenty of rest. May take over-the-counter ibuprofen or Tylenol as needed for pain, fever, or general discomfort. Recommend normal saline nasal spray to help with nasal congestion throughout the day. For your cough, it may be helpful to use a humidifier at bedtime during sleep. If your symptoms fail to improve within the next 7 to 10 days, please follow-up in our clinic or with your primary care physician.[name]

## 2022-01-12 DIAGNOSIS — M7989 Other specified soft tissue disorders: Secondary | ICD-10-CM | POA: Diagnosis not present

## 2022-01-12 DIAGNOSIS — E782 Mixed hyperlipidemia: Secondary | ICD-10-CM | POA: Diagnosis not present

## 2022-01-12 DIAGNOSIS — Z0000 Encounter for general adult medical examination without abnormal findings: Secondary | ICD-10-CM | POA: Diagnosis not present

## 2022-01-12 DIAGNOSIS — R7303 Prediabetes: Secondary | ICD-10-CM | POA: Diagnosis not present

## 2022-01-12 DIAGNOSIS — F5101 Primary insomnia: Secondary | ICD-10-CM | POA: Diagnosis not present

## 2022-01-26 DIAGNOSIS — E782 Mixed hyperlipidemia: Secondary | ICD-10-CM | POA: Diagnosis not present

## 2022-01-26 DIAGNOSIS — R252 Cramp and spasm: Secondary | ICD-10-CM | POA: Diagnosis not present

## 2022-01-26 DIAGNOSIS — Z0000 Encounter for general adult medical examination without abnormal findings: Secondary | ICD-10-CM | POA: Diagnosis not present

## 2022-01-26 DIAGNOSIS — R7303 Prediabetes: Secondary | ICD-10-CM | POA: Diagnosis not present

## 2022-02-18 DIAGNOSIS — R030 Elevated blood-pressure reading, without diagnosis of hypertension: Secondary | ICD-10-CM | POA: Diagnosis not present

## 2022-02-18 DIAGNOSIS — E669 Obesity, unspecified: Secondary | ICD-10-CM | POA: Diagnosis not present

## 2022-02-18 DIAGNOSIS — J069 Acute upper respiratory infection, unspecified: Secondary | ICD-10-CM | POA: Diagnosis not present

## 2022-02-18 DIAGNOSIS — Z6836 Body mass index (BMI) 36.0-36.9, adult: Secondary | ICD-10-CM | POA: Diagnosis not present

## 2022-03-03 DIAGNOSIS — Z23 Encounter for immunization: Secondary | ICD-10-CM | POA: Diagnosis not present

## 2022-04-12 ENCOUNTER — Other Ambulatory Visit: Payer: Self-pay | Admitting: Family Medicine

## 2022-04-12 NOTE — Telephone Encounter (Signed)
Unable to refill per protocol, last refill by another provider. Will refuse request.  Requested Prescriptions  Pending Prescriptions Disp Refills   albuterol (VENTOLIN HFA) 108 (90 Base) MCG/ACT inhaler [Pharmacy Med Name: ALBUTEROL HFA (PROAIR) INHALER] 8.5 each     Sig: INHALE 1-2 PUFFS BY MOUTH EVERY 6 HOURS AS NEEDED FOR WHEEZE OR SHORTNESS OF BREATH     There is no refill protocol information for this order

## 2022-05-10 ENCOUNTER — Other Ambulatory Visit: Payer: Self-pay | Admitting: Family Medicine

## 2022-05-10 DIAGNOSIS — Z1231 Encounter for screening mammogram for malignant neoplasm of breast: Secondary | ICD-10-CM

## 2022-07-05 ENCOUNTER — Ambulatory Visit
Admission: RE | Admit: 2022-07-05 | Discharge: 2022-07-05 | Disposition: A | Discharge status: Home / Self Care | Payer: BC Managed Care – PPO | Source: Ambulatory Visit | Attending: Family Medicine | Admitting: Family Medicine

## 2022-07-05 DIAGNOSIS — Z1231 Encounter for screening mammogram for malignant neoplasm of breast: Secondary | ICD-10-CM

## 2022-07-13 ENCOUNTER — Other Ambulatory Visit: Payer: Self-pay | Admitting: Family Medicine

## 2022-07-13 DIAGNOSIS — N6489 Other specified disorders of breast: Secondary | ICD-10-CM

## 2022-08-02 ENCOUNTER — Ambulatory Visit
Admission: RE | Admit: 2022-08-02 | Discharge: 2022-08-02 | Disposition: A | Discharge status: Home / Self Care | Payer: BC Managed Care – PPO | Source: Ambulatory Visit | Attending: Family Medicine | Admitting: Family Medicine

## 2022-08-02 DIAGNOSIS — N6489 Other specified disorders of breast: Secondary | ICD-10-CM

## 2022-08-02 DIAGNOSIS — R928 Other abnormal and inconclusive findings on diagnostic imaging of breast: Secondary | ICD-10-CM | POA: Diagnosis not present

## 2022-08-02 DIAGNOSIS — N6012 Diffuse cystic mastopathy of left breast: Secondary | ICD-10-CM | POA: Diagnosis not present

## 2022-08-17 ENCOUNTER — Ambulatory Visit (INDEPENDENT_AMBULATORY_CARE_PROVIDER_SITE_OTHER): Payer: BC Managed Care – PPO

## 2022-08-17 ENCOUNTER — Ambulatory Visit: Payer: BC Managed Care – PPO | Admitting: Podiatry

## 2022-08-17 ENCOUNTER — Encounter: Payer: Self-pay | Admitting: Podiatry

## 2022-08-17 DIAGNOSIS — E6609 Other obesity due to excess calories: Secondary | ICD-10-CM | POA: Insufficient documentation

## 2022-08-17 DIAGNOSIS — Q6689 Other  specified congenital deformities of feet: Secondary | ICD-10-CM

## 2022-08-17 DIAGNOSIS — M7750 Other enthesopathy of unspecified foot: Secondary | ICD-10-CM

## 2022-08-17 DIAGNOSIS — F17200 Nicotine dependence, unspecified, uncomplicated: Secondary | ICD-10-CM | POA: Insufficient documentation

## 2022-08-17 DIAGNOSIS — F3341 Major depressive disorder, recurrent, in partial remission: Secondary | ICD-10-CM | POA: Insufficient documentation

## 2022-08-17 DIAGNOSIS — F5101 Primary insomnia: Secondary | ICD-10-CM | POA: Insufficient documentation

## 2022-08-17 DIAGNOSIS — M7751 Other enthesopathy of right foot: Secondary | ICD-10-CM

## 2022-08-17 DIAGNOSIS — F411 Generalized anxiety disorder: Secondary | ICD-10-CM | POA: Insufficient documentation

## 2022-08-17 DIAGNOSIS — M19079 Primary osteoarthritis, unspecified ankle and foot: Secondary | ICD-10-CM

## 2022-08-17 DIAGNOSIS — R7303 Prediabetes: Secondary | ICD-10-CM | POA: Insufficient documentation

## 2022-08-17 DIAGNOSIS — M7989 Other specified soft tissue disorders: Secondary | ICD-10-CM | POA: Insufficient documentation

## 2022-08-17 DIAGNOSIS — E782 Mixed hyperlipidemia: Secondary | ICD-10-CM | POA: Insufficient documentation

## 2022-08-17 DIAGNOSIS — Z6836 Body mass index (BMI) 36.0-36.9, adult: Secondary | ICD-10-CM | POA: Insufficient documentation

## 2022-08-17 MED ORDER — DICLOFENAC SODIUM 1 % EX GEL: 2.0000 g | Freq: Four times a day (QID) | CUTANEOUS | 2 refills | Status: AC

## 2022-08-17 NOTE — Patient Instructions (Signed)
Call [facility] to schedule your CT at the below locations.  Please allow at least 1 business day after your visit to process the referral.  It may take longer depending on approval from insurance.  Please let me know if you have issues or problems scheduling the CT   [facility] [phone] [address] [place], [place] [address]  [facility] [phone] [address] [place], [place] [address]  

## 2022-08-21 NOTE — Progress Notes (Signed)
  Subjective:  Patient ID: [name], female    DOB: March 02, 1961,  MRN: [id number]  Chief Complaint  Patient presents with   Foot Pain    np right foot/ankle pain, swelling    62 y.o. female presents with the above complaint. History confirmed with patient. Denies any traumatic inciting event. Notes pain and swelling and stiffness with ambulation   Objective:  Physical Exam: warm, good capillary refill, no trophic changes or ulcerative lesions, normal DP and PT pulses, normal sensory exam, and right foot shows tenderness and edema about the sinus tarsi. Some limitation and pain to palpation with ROM of subtalar joint. No pain in ankle joint itself or limited motion   Radiographs: Multiple views x-ray of the right foot: pes cavus foot type, possible CN bar noted on oblique, some midtarsal degenerative changes in lateral [place] joint noted Assessment:   1. Coalition, tarsal   2. Arthritis of midtarsal joint      Plan:  Patient was evaluated and treated and all questions answered.   We reviewed her radiographs we discussed possible etiologies, I do think there is possibility of CN bar vs midtarsal arthritis. I recommended advanced imaging eval with CT scan, if CN bar present will likely require surgical resection. If degenerative changes, can pursue further non op treatment for now. I recommended topical diclofenac gel to use, rx sent to pharmacy as she cannot tolerate oral NSAIDs, has easy bleeding with this. Follow up with me after CT  No follow-ups on file.

## 2022-08-25 ENCOUNTER — Ambulatory Visit
Admission: RE | Admit: 2022-08-25 | Discharge: 2022-08-25 | Disposition: A | Discharge status: Home / Self Care | Payer: BC Managed Care – PPO | Source: Ambulatory Visit | Attending: Podiatry | Admitting: Podiatry

## 2022-08-25 DIAGNOSIS — M7989 Other specified soft tissue disorders: Secondary | ICD-10-CM | POA: Diagnosis not present

## 2022-08-25 DIAGNOSIS — M19079 Primary osteoarthritis, unspecified ankle and foot: Secondary | ICD-10-CM

## 2022-08-25 DIAGNOSIS — Q6689 Other  specified congenital deformities of feet: Secondary | ICD-10-CM

## 2022-08-30 ENCOUNTER — Telehealth: Payer: Self-pay | Admitting: *Deleted

## 2022-08-30 NOTE — Telephone Encounter (Signed)
Patient is calling for CT results, please advise.

## 2022-09-02 DIAGNOSIS — B3731 Acute candidiasis of vulva and vagina: Secondary | ICD-10-CM | POA: Diagnosis not present

## 2022-09-02 DIAGNOSIS — M545 Low back pain, unspecified: Secondary | ICD-10-CM | POA: Diagnosis not present

## 2022-09-02 DIAGNOSIS — R300 Dysuria: Secondary | ICD-10-CM | POA: Diagnosis not present

## 2022-10-06 ENCOUNTER — Ambulatory Visit: Payer: BC Managed Care – PPO | Admitting: Podiatry

## 2022-10-06 ENCOUNTER — Encounter: Payer: Self-pay | Admitting: Podiatry

## 2022-10-06 DIAGNOSIS — G587 Mononeuritis multiplex: Secondary | ICD-10-CM | POA: Diagnosis not present

## 2022-10-06 DIAGNOSIS — M19079 Primary osteoarthritis, unspecified ankle and foot: Secondary | ICD-10-CM

## 2022-10-06 DIAGNOSIS — M25571 Pain in right ankle and joints of right foot: Secondary | ICD-10-CM

## 2022-10-06 NOTE — Progress Notes (Signed)
Subjective:  Patient ID: [name], female    DOB: 1960/08/17,  MRN: [id number]  Chief Complaint  Patient presents with   Foot Pain    est - right foot pain - injection needed - her pain is in the same area. She has developed burning and tingling as well    62 y.o. female presents with the above complaint. History confirmed with patient.  Says she is still dealing with swelling to Bako's problems now feeling a burning tingling pain that distributes from the toes up to the level of the ankle.  Feels a bump on the top of the foot and pain on the outside of the ankle  Objective:  Physical Exam: warm, good capillary refill, no trophic changes or ulcerative lesions, normal DP and PT pulses, normal sensory exam, and right foot shows tenderness and edema about the sinus tarsi. Some limitation and pain to palpation with ROM of subtalar joint. No pain in ankle joint itself or limited motion   Radiographs: Multiple views x-ray of the right foot: pes cavus foot type, possible CN bar noted on oblique, some midtarsal degenerative changes in lateral [place] joint noted  Study Result  Narrative & Impression  CLINICAL DATA:  Right foot and ankle swelling and pain for 2 months   EXAM: CT OF THE RIGHT FOOT WITHOUT CONTRAST   TECHNIQUE: Multidetector CT imaging of the right foot was performed according to the standard protocol. Multiplanar CT image reconstructions were also generated.   RADIATION DOSE REDUCTION: This exam was performed according to the departmental dose-optimization program which includes automated exposure control, adjustment of the mA and/or kV according to patient size and/or use of iterative reconstruction technique.   COMPARISON:  None Available.   FINDINGS: Bones/Joint/Cartilage   No fracture or dislocation. Normal alignment. Small talonavicular joint effusion. No tarsal coalition.   Tiny plantar calcaneal spur. Enthesopathic changes of the Achilles tendon  insertion.   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 Mild osteoarthritis of the talonavicular joint.   Ligaments   Ligaments are suboptimally evaluated by CT.   Muscles and Tendons Muscles are normal. No muscle atrophy. No intramuscular fluid collection or hematoma. Flexor, extensor, peroneal and Achilles tendons are intact.   Soft tissue No fluid collection or hematoma.  No soft tissue mass.   IMPRESSION: 1. No acute osseous injury of the right foot. 2. 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 3. Mild osteoarthritis of the talonavicular joint with a small joint effusion.     Electronically Signed   By: Elige Ko M.D.   On: 08/27/2022 07:25   Assessment:   1. Mononeuropathy multiplex   2. 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   We reviewed her CT results.  No evidence of sinus tarsi syndrome.  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  Most of her symptoms today appear to be neuritic in nature she describes burning tingling pain in a stocking distribution only on the right side up to the level of the mid shin.  Would like her to be seen by neurology for evaluation of the neuritic possibilities.  Do not think it is standard polyneuropathy.  No history of sciatica or low back pain.  I did recommend sinus tarsi injection to see if this alleviates any pain and inflammation in the area of tenderness.  Following  verbal consent and sterile prep with Betadine,  10 mg of Kenalog, 4 mg of dexamethasone and 1 cc of 0.5% Marcaine plain was injected in the sinus tarsi.  She tolerated well.  No follow-ups on file.

## 2022-10-11 ENCOUNTER — Encounter: Payer: Self-pay | Admitting: Neurology

## 2022-10-18 ENCOUNTER — Encounter: Payer: Self-pay | Admitting: Neurology

## 2022-10-18 ENCOUNTER — Ambulatory Visit: Payer: BC Managed Care – PPO | Admitting: Neurology

## 2022-10-18 VITALS — BP 152/75 | HR 81 | Ht 60.0 in | Wt 185.0 lb

## 2022-10-18 DIAGNOSIS — R252 Cramp and spasm: Secondary | ICD-10-CM

## 2022-10-18 DIAGNOSIS — R202 Paresthesia of skin: Secondary | ICD-10-CM | POA: Diagnosis not present

## 2022-10-18 DIAGNOSIS — M5416 Radiculopathy, lumbar region: Secondary | ICD-10-CM | POA: Diagnosis not present

## 2022-10-18 MED ORDER — GABAPENTIN 300 MG PO CAPS: 300.0000 mg | ORAL_CAPSULE | Freq: Every day | ORAL | 3 refills | Status: DC

## 2022-10-18 NOTE — Progress Notes (Signed)
[facility] Clinic Note - Initial Visit   Date: 10/18/2022   [name] MRN: [id number] DOB: 09-27-1960   Dear Dr. Lilian Kapur:  Thank you for your kind referral of [name] for consultation of right foot pain and numbness/tingling. Although her history is well known to you, please allow [place] to reiterate it for the purpose of our medical record. The patient was accompanied to the clinic by self.    [name] is a 62 y.o. right-handed female with hyperlipidemia and tobacco use presenting for evaluation of right foot numbness/tingling.   IMPRESSION/PLAN: Right leg and foot paresthesias and pain, suggestive of possible lumbar radiculopathy at L5-S1  - NCS/EMG of the right leg  - Start gabapentin 300mg  at bedtime  - Start physical therapy for low back strengthening  Further recommendations pending results.  ------------------------------------------------------------- History of present illness: Starting in 2023, she began noticing that her right foot started swelling.  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  She also complains of sharp pain over the dorsum of the foot and tingling sensation over the ankle.  She has painful muscle cramps in the right leg.  She has numbness over the right 3rd - 5th toes. She takes ibuprofen which helps her pain.  She occasionally has low back pain which radiates down her right leg.  She denies similar symptoms in the left leg.   She works fulltime for a [employer].  She lives home with fiance.     Past Medical History:  Diagnosis Date   Hyperlipidemia     History reviewed. No pertinent surgical history.   Medications:  Outpatient Encounter Medications as of 10/18/2022  Medication Sig   atorvastatin (LIPITOR) 10 MG tablet Take 10 mg by mouth daily.   diclofenac Sodium (VOLTAREN) 1 % GEL Apply 2 g topically 4  (four) times daily. Rub into affected area of foot 2 to 4 times daily   furosemide (LASIX) 20 MG tablet Take 20 mg by mouth daily as needed.   traZODone (DESYREL) 50 MG tablet Take 50 mg by mouth at bedtime.   [DISCONTINUED] amoxicillin-clavulanate (AUGMENTIN) 875-125 MG tablet Take 1 tablet by mouth every 12 (twelve) hours.   [DISCONTINUED] albuterol (VENTOLIN HFA) 108 (90 Base) MCG/ACT inhaler Inhale 1-2 puffs into the lungs every 6 (six) hours as needed for wheezing or shortness of breath. (Patient not taking: Reported on 10/18/2022)   [DISCONTINUED] clotrimazole (LOTRIMIN) 1 % cream 1 application Externally Twice a day for 30 days (Patient not taking: Reported on 10/18/2022)   [DISCONTINUED] diclofenac (VOLTAREN) 75 MG EC tablet Take by mouth. (Patient not taking: Reported on 10/18/2022)   [DISCONTINUED] fluticasone (FLONASE) 50 MCG/ACT nasal spray Place 2 sprays into both nostrils daily. (Patient not taking: Reported on 10/18/2022)   [DISCONTINUED] promethazine-dextromethorphan (PROMETHAZINE-DM) 6.25-15 MG/5ML syrup Take 5 mLs by mouth 4 (four) times daily as needed. (Patient not taking: Reported on 10/18/2022)   No facility-administered encounter medications on file as of 10/18/2022.    Allergies:  Allergies  Allergen Reactions   Bupropion Hcl Er (Xl)     Other reaction(s): rash, irritability   Other Itching    Tyelox    Family History: Family History  Problem Relation Age of Onset   Diabetes Mother    Throat cancer Mother    Arthritis Sister    Breast cancer Sister    Other Brother  Knee problems   Diabetes Maternal Grandmother    Pneumonia Maternal Grandfather     Social History: Social History   Tobacco Use   Smoking status: Every Day    Packs/day: .25    Types: Cigarettes   Smokeless tobacco: Never  Substance Use Topics   Alcohol use: Never   Drug use: Never   Social History   Social History Narrative   Right Handed    Lives in one story home. Lives with  her fiance     Vital Signs:  BP (!) 152/75   Pulse 81   Ht 5' (1.524 m)   Wt 185 lb (83.9 kg)   SpO2 95%   BMI 36.13 kg/m   Neurological Exam: MENTAL STATUS including orientation to time, place, person, recent and remote memory, attention span and concentration, language, and fund of knowledge is normal.  Speech is not dysarthric.  CRANIAL NERVES: II:  No visual field defects.     III-IV-VI: Pupils equal round and reactive to light.  Normal conjugate, extra-ocular eye movements in all directions of gaze.  No nystagmus.  No ptosis.   V:  Normal facial sensation.    VII:  Normal facial symmetry and movements.   VIII:  Normal hearing and vestibular function.   IX-X:  Normal palatal movement.   XI:  Normal shoulder shrug and head rotation.   XII:  Normal tongue strength and range of motion, no deviation or fasciculation.  MOTOR:  No atrophy, fasciculations or abnormal movements.  No pronator drift.   Upper Extremity:  Right  Left  Deltoid  5/5   5/5   Biceps  5/5   5/5   Triceps  5/5   5/5   Wrist extensors  5/5   5/5   Wrist flexors  5/5   5/5   Finger extensors  5/5   5/5   Finger flexors  5/5   5/5   Dorsal interossei  5/5   5/5   Abductor pollicis  5/5   5/5   Tone (Ashworth scale)  0  0   Lower Extremity:  Right  Left  Hip flexors  5/5   5/5   Knee flexors  5/5   5/5   Knee extensors  5/5   5/5   Dorsiflexors  5/5   5/5   Plantarflexors  5-/5   5/5   Toe extensors  5-/5   5/5   Toe flexors  5/5   5/5   Tone (Ashworth scale)  0  0   MSRs:                                           Right        Left brachioradialis 2+  2+  biceps 2+  2+  triceps 2+  2+  patellar 2+  2+  ankle jerk 2+  2+  Hoffman no  no  plantar response down  down   SENSORY:  Reduced pin prick and temperature over the dorsum of the right foot.  Vibration is intact in the right foot.  Sensation intact in the arms and left leg.  COORDINATION/GAIT: Normal finger-to- nose-finger.  Intact rapid  alternating movements bilaterally.  Able to rise from a chair without using arms.  Gait narrow based and stable. Tandem and stressed gait intact.    Thank you for allowing me to participate  in patient's care.  If I can answer any additional questions, I would be pleased to do so.    Sincerely,    Tomas Schamp K. Allena Katz, DO

## 2022-10-18 NOTE — Patient Instructions (Signed)
Nerve testing of the right leg  Start physical therapy   ELECTROMYOGRAM AND NERVE CONDUCTION STUDIES (EMG/NCS) INSTRUCTIONS  How to Prepare The neurologist conducting the EMG will need to know if you have certain medical conditions. Tell the neurologist and other EMG lab personnel if you: Have a pacemaker or any other electrical medical device Take blood-thinning medications Have hemophilia, a blood-clotting disorder that causes prolonged bleeding Bathing Take a shower or bath shortly before your exam in order to remove oils from your skin. Don't apply lotions or creams before the exam.  What to Expect You'll likely be asked to change into a hospital gown for the procedure and lie down on an examination table. The following explanations can help you understand what will happen during the exam.  Electrodes. The neurologist or a technician places surface electrodes at various locations on your skin depending on where you're experiencing symptoms. Or the neurologist may insert needle electrodes at different sites depending on your symptoms.  Sensations. The electrodes will at times transmit a tiny electrical current that you may feel as a twinge or spasm. The needle electrode may cause discomfort or pain that usually ends shortly after the needle is removed. If you are concerned about discomfort or pain, you may want to talk to the neurologist about taking a short break during the exam.  Instructions. During the needle EMG, the neurologist will assess whether there is any spontaneous electrical activity when the muscle is at rest - activity that isn't present in healthy muscle tissue - and the degree of activity when you slightly contract the muscle.  He or she will give you instructions on resting and contracting a muscle at appropriate times. Depending on what muscles and nerves the neurologist is examining, he or she may ask you to change positions during the exam.  After your EMG You may  experience some temporary, minor bruising where the needle electrode was inserted into your muscle. This bruising should fade within several days. If it persists, contact your primary care doctor.

## 2022-10-26 DIAGNOSIS — M79671 Pain in right foot: Secondary | ICD-10-CM | POA: Diagnosis not present

## 2022-10-26 DIAGNOSIS — R2689 Other abnormalities of gait and mobility: Secondary | ICD-10-CM | POA: Diagnosis not present

## 2022-10-26 DIAGNOSIS — M6281 Muscle weakness (generalized): Secondary | ICD-10-CM | POA: Diagnosis not present

## 2022-10-28 DIAGNOSIS — R2689 Other abnormalities of gait and mobility: Secondary | ICD-10-CM | POA: Diagnosis not present

## 2022-10-28 DIAGNOSIS — M79671 Pain in right foot: Secondary | ICD-10-CM | POA: Diagnosis not present

## 2022-10-28 DIAGNOSIS — M6281 Muscle weakness (generalized): Secondary | ICD-10-CM | POA: Diagnosis not present

## 2022-11-01 DIAGNOSIS — R2689 Other abnormalities of gait and mobility: Secondary | ICD-10-CM | POA: Diagnosis not present

## 2022-11-01 DIAGNOSIS — M6281 Muscle weakness (generalized): Secondary | ICD-10-CM | POA: Diagnosis not present

## 2022-11-01 DIAGNOSIS — M79671 Pain in right foot: Secondary | ICD-10-CM | POA: Diagnosis not present

## 2022-11-04 DIAGNOSIS — M6281 Muscle weakness (generalized): Secondary | ICD-10-CM | POA: Diagnosis not present

## 2022-11-04 DIAGNOSIS — M79671 Pain in right foot: Secondary | ICD-10-CM | POA: Diagnosis not present

## 2022-11-04 DIAGNOSIS — R2689 Other abnormalities of gait and mobility: Secondary | ICD-10-CM | POA: Diagnosis not present

## 2022-11-09 DIAGNOSIS — M6281 Muscle weakness (generalized): Secondary | ICD-10-CM | POA: Diagnosis not present

## 2022-11-09 DIAGNOSIS — R2689 Other abnormalities of gait and mobility: Secondary | ICD-10-CM | POA: Diagnosis not present

## 2022-11-09 DIAGNOSIS — M79671 Pain in right foot: Secondary | ICD-10-CM | POA: Diagnosis not present

## 2022-11-10 ENCOUNTER — Ambulatory Visit: Payer: BC Managed Care – PPO | Admitting: Neurology

## 2022-11-10 DIAGNOSIS — M5416 Radiculopathy, lumbar region: Secondary | ICD-10-CM | POA: Diagnosis not present

## 2022-11-10 DIAGNOSIS — R202 Paresthesia of skin: Secondary | ICD-10-CM

## 2022-11-10 DIAGNOSIS — R252 Cramp and spasm: Secondary | ICD-10-CM | POA: Diagnosis not present

## 2022-11-10 NOTE — Progress Notes (Signed)
    Follow-up Visit   Date: 11/10/2022    Tewana Bohlen MRN: [id number] DOB: 09/25/1960    Marelyn Rouser is a 62 y.o. right-handed Caucasian female with hyperlipidemia and tobacco use returning to the clinic for follow-up of right leg pain and numbness/tingling.  The patient was accompanied to the clinic by self.    IMPRESSION/PLAN: Right leg and foot paresthesias, likely due to L5 radiculopathy.  Findings of EMG reviewed with patient which shows mild L5 radiculopathy, no evidence of large fiber neuropathy.  Symptoms are slowly improving with PT.  - Continue PT for low back strengthening  - Continue gabapentin 300mg  at bedtime  2.  Right foot swelling, most likely musculoskeletal.  - Follow-up with PCP/podiatry   Return to clinic if symptoms do not improve   --------------------------------------------- History of present illness: Starting in 2023, she began noticing that her right foot started swelling.  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  She also complains of sharp pain over the dorsum of the foot and tingling sensation over the ankle.  She has painful muscle cramps in the right leg.  She has numbness over the right 3rd - 5th toes. She takes ibuprofen which helps her pain.  She occasionally has low back pain which radiates down her right leg.  She denies similar symptoms in the left leg.    She works fulltime for [employer].  She lives home with fiance.   UPDATE 11/10/2022:  She is here for EDX of the right leg.  She has done PT for the past 3 weeks and reports that her low back and right leg pain and tingling is less.  She is able to walk on the foot better. She continues to have some swelling in the foot.  Medications:  Current Outpatient Medications on File Prior to Visit  Medication Sig Dispense Refill   atorvastatin (LIPITOR) 10 MG tablet Take 10 mg by mouth daily.      diclofenac Sodium (VOLTAREN) 1 % GEL Apply 2 g topically 4 (four) times daily. Rub into affected area of foot 2 to 4 times daily 100 g 2   furosemide (LASIX) 20 MG tablet Take 20 mg by mouth daily as needed.     gabapentin (NEURONTIN) 300 MG capsule Take 1 capsule (300 mg total) by mouth at bedtime. 30 capsule 3   traZODone (DESYREL) 50 MG tablet Take 50 mg by mouth at bedtime.     No current facility-administered medications on file prior to visit.    Allergies:  Allergies  Allergen Reactions   Bupropion Hcl Er (Xl)     Other reaction(s): rash, irritability   Other Itching    Tyelox    Vital Signs:  There were no vitals taken for this visit.    Exam deferred  Data: NCS/EMG of the right leg 11/10/2022:   Chronic L5 radiculopathy affecting the right lower extremity, mild. There is no evidence of a large fiber sensorimotor polyneuropathy affecting the left lower extremity.   Thank you for allowing me to participate in patient's care.  If I can answer any additional questions, I would be pleased to do so.    Sincerely,    Caci Orren K. Allena Katz, DO

## 2022-11-10 NOTE — Procedures (Signed)
  [facility]  [address] [place], [address]  [place], [place] [address] Tel: [phone] Fax: [phone] Test Date:  11/10/2022  Patient: [name] DOB: 02/20/1961 Physician: Nita Sickle, DO  Sex: Female Height: 5\' 0"  Ref Phys: Nita Sickle, DO  ID#: [id number]   Technician:    History: This is a 62 year old female referred for evaluation of right foot paresthesias and pain.  NCV & EMG Findings: Extensive electrodiagnostic testing of the right lower extremity shows:  Right sural and superficial peroneal sensory responses are within normal limits. Right peroneal motor response at the extensor digitorum brevis is reduced, and normal at the tibialis anterior.  Right tibial motor response is within normal limits. Right tibial H reflex study is within normal limits. Chronic motor axon loss changes are seen affecting the L5 myotome on the right, without accompanying active denervation.  Impression: Chronic L5 radiculopathy affecting the right lower extremity, mild. There is no evidence of a large fiber sensorimotor polyneuropathy affecting the left lower extremity   ___________________________ Nita Sickle, DO    Nerve Conduction Studies   Stim Site NR Peak (ms) Norm Peak (ms) O-P Amp (V) Norm O-P Amp  Right Sup Peroneal Anti Sensory (Ant Lat Mall)  32 C  12 cm    2.4 <4.6 7.3 >3  Site 2    2.6  9.0   Right Sural Anti Sensory (Lat Mall)  32 C  Calf    2.6 <4.6 6.3 >3     Stim Site NR Onset (ms) Norm Onset (ms) O-P Amp (mV) Norm O-P Amp Site1 Site2 Delta-0 (ms) Dist (cm) Vel (m/s) Norm Vel (m/s)  Right Peroneal Motor (Ext Dig Brev)  32 C  Ankle    5.0 <6.0 *1.0 >2.5 B Fib Ankle 8.0 35.0 44 >40  B Fib    13.0  0.8  Poplt B Fib 1.5 8.0 53 >40  Poplt    14.5  0.8         Right Peroneal TA Motor (Tib Ant)  32 C  Fib Head    2.6 <4.5 5.5 >3 Poplit Fib Head 1.1 7.0 64 >40  Poplit    3.7 <5.7 5.2         Right Tibial Motor (Abd Hall Brev)  32 C  Ankle    2.9  <6.0 10.1 >4 Knee Ankle 8.7 40.0 46 >40  Knee    11.6  7.8          Electromyography   Side Muscle Ins.Act Fibs Fasc Recrt Amp Dur Poly Activation Comment  Right AntTibialis Nml Nml Nml *1- *1+ *1+ *1+ Nml N/A  Right Gastroc Nml Nml Nml Nml Nml Nml Nml Nml N/A  Right Flex Dig Long Nml Nml Nml *1- *1+ *1+ *1+ Nml N/A  Right RectFemoris Nml Nml Nml Nml Nml Nml Nml Nml N/A  Right BicepsFemS Nml Nml Nml Nml Nml Nml Nml Nml N/A  Right GluteusMed Nml Nml Nml *1- *1+ *1+ *1+ Nml N/A      Waveforms:

## 2022-11-16 DIAGNOSIS — M79671 Pain in right foot: Secondary | ICD-10-CM | POA: Diagnosis not present

## 2022-11-16 DIAGNOSIS — R2689 Other abnormalities of gait and mobility: Secondary | ICD-10-CM | POA: Diagnosis not present

## 2022-11-16 DIAGNOSIS — M6281 Muscle weakness (generalized): Secondary | ICD-10-CM | POA: Diagnosis not present

## 2022-11-18 DIAGNOSIS — M6281 Muscle weakness (generalized): Secondary | ICD-10-CM | POA: Diagnosis not present

## 2022-11-18 DIAGNOSIS — R2689 Other abnormalities of gait and mobility: Secondary | ICD-10-CM | POA: Diagnosis not present

## 2022-11-18 DIAGNOSIS — M79671 Pain in right foot: Secondary | ICD-10-CM | POA: Diagnosis not present

## 2022-11-23 DIAGNOSIS — M6281 Muscle weakness (generalized): Secondary | ICD-10-CM | POA: Diagnosis not present

## 2022-11-23 DIAGNOSIS — R2689 Other abnormalities of gait and mobility: Secondary | ICD-10-CM | POA: Diagnosis not present

## 2022-11-23 DIAGNOSIS — M79671 Pain in right foot: Secondary | ICD-10-CM | POA: Diagnosis not present

## 2022-11-25 DIAGNOSIS — M6281 Muscle weakness (generalized): Secondary | ICD-10-CM | POA: Diagnosis not present

## 2022-11-25 DIAGNOSIS — M79671 Pain in right foot: Secondary | ICD-10-CM | POA: Diagnosis not present

## 2022-11-25 DIAGNOSIS — R2689 Other abnormalities of gait and mobility: Secondary | ICD-10-CM | POA: Diagnosis not present

## 2022-11-30 DIAGNOSIS — R2689 Other abnormalities of gait and mobility: Secondary | ICD-10-CM | POA: Diagnosis not present

## 2022-11-30 DIAGNOSIS — M79671 Pain in right foot: Secondary | ICD-10-CM | POA: Diagnosis not present

## 2022-11-30 DIAGNOSIS — M6281 Muscle weakness (generalized): Secondary | ICD-10-CM | POA: Diagnosis not present

## 2022-12-02 DIAGNOSIS — R2689 Other abnormalities of gait and mobility: Secondary | ICD-10-CM | POA: Diagnosis not present

## 2022-12-02 DIAGNOSIS — M79671 Pain in right foot: Secondary | ICD-10-CM | POA: Diagnosis not present

## 2022-12-02 DIAGNOSIS — M6281 Muscle weakness (generalized): Secondary | ICD-10-CM | POA: Diagnosis not present

## 2022-12-07 DIAGNOSIS — R2689 Other abnormalities of gait and mobility: Secondary | ICD-10-CM | POA: Diagnosis not present

## 2022-12-07 DIAGNOSIS — M6281 Muscle weakness (generalized): Secondary | ICD-10-CM | POA: Diagnosis not present

## 2022-12-07 DIAGNOSIS — M79671 Pain in right foot: Secondary | ICD-10-CM | POA: Diagnosis not present

## 2022-12-09 DIAGNOSIS — M6281 Muscle weakness (generalized): Secondary | ICD-10-CM | POA: Diagnosis not present

## 2022-12-09 DIAGNOSIS — R2689 Other abnormalities of gait and mobility: Secondary | ICD-10-CM | POA: Diagnosis not present

## 2022-12-09 DIAGNOSIS — M79671 Pain in right foot: Secondary | ICD-10-CM | POA: Diagnosis not present

## 2022-12-20 DIAGNOSIS — M6281 Muscle weakness (generalized): Secondary | ICD-10-CM | POA: Diagnosis not present

## 2022-12-20 DIAGNOSIS — M79671 Pain in right foot: Secondary | ICD-10-CM | POA: Diagnosis not present

## 2022-12-20 DIAGNOSIS — R2689 Other abnormalities of gait and mobility: Secondary | ICD-10-CM | POA: Diagnosis not present

## 2022-12-21 DIAGNOSIS — E669 Obesity, unspecified: Secondary | ICD-10-CM | POA: Diagnosis not present

## 2022-12-21 DIAGNOSIS — J019 Acute sinusitis, unspecified: Secondary | ICD-10-CM | POA: Diagnosis not present

## 2022-12-21 DIAGNOSIS — Z6834 Body mass index (BMI) 34.0-34.9, adult: Secondary | ICD-10-CM | POA: Diagnosis not present

## 2022-12-21 DIAGNOSIS — R509 Fever, unspecified: Secondary | ICD-10-CM | POA: Diagnosis not present

## 2023-01-25 DIAGNOSIS — M7989 Other specified soft tissue disorders: Secondary | ICD-10-CM | POA: Diagnosis not present

## 2023-01-25 DIAGNOSIS — R7303 Prediabetes: Secondary | ICD-10-CM | POA: Diagnosis not present

## 2023-01-25 DIAGNOSIS — E782 Mixed hyperlipidemia: Secondary | ICD-10-CM | POA: Diagnosis not present

## 2023-01-25 DIAGNOSIS — Z0000 Encounter for general adult medical examination without abnormal findings: Secondary | ICD-10-CM | POA: Diagnosis not present

## 2023-01-25 DIAGNOSIS — R21 Rash and other nonspecific skin eruption: Secondary | ICD-10-CM | POA: Diagnosis not present

## 2023-06-23 ENCOUNTER — Other Ambulatory Visit: Payer: Self-pay | Admitting: Family Medicine

## 2023-06-23 DIAGNOSIS — Z1231 Encounter for screening mammogram for malignant neoplasm of breast: Secondary | ICD-10-CM

## 2023-08-03 ENCOUNTER — Ambulatory Visit
Admission: RE | Admit: 2023-08-03 | Discharge: 2023-08-03 | Disposition: A | Discharge status: Home / Self Care | Payer: BC Managed Care – PPO | Source: Ambulatory Visit | Attending: Family Medicine | Admitting: Family Medicine

## 2023-08-03 DIAGNOSIS — Z1231 Encounter for screening mammogram for malignant neoplasm of breast: Secondary | ICD-10-CM

## 2023-08-12 DIAGNOSIS — R030 Elevated blood-pressure reading, without diagnosis of hypertension: Secondary | ICD-10-CM | POA: Diagnosis not present

## 2023-08-12 DIAGNOSIS — J22 Unspecified acute lower respiratory infection: Secondary | ICD-10-CM | POA: Diagnosis not present

## 2024-02-01 DIAGNOSIS — M545 Low back pain, unspecified: Secondary | ICD-10-CM | POA: Diagnosis not present

## 2024-02-01 DIAGNOSIS — E782 Mixed hyperlipidemia: Secondary | ICD-10-CM | POA: Diagnosis not present

## 2024-02-01 DIAGNOSIS — Z6835 Body mass index (BMI) 35.0-35.9, adult: Secondary | ICD-10-CM | POA: Diagnosis not present

## 2024-02-01 DIAGNOSIS — R7303 Prediabetes: Secondary | ICD-10-CM | POA: Diagnosis not present

## 2024-02-01 DIAGNOSIS — F5101 Primary insomnia: Secondary | ICD-10-CM | POA: Diagnosis not present

## 2024-02-01 DIAGNOSIS — E6609 Other obesity due to excess calories: Secondary | ICD-10-CM | POA: Diagnosis not present

## 2024-02-01 DIAGNOSIS — Z0000 Encounter for general adult medical examination without abnormal findings: Secondary | ICD-10-CM | POA: Diagnosis not present

## 2024-02-01 DIAGNOSIS — F17200 Nicotine dependence, unspecified, uncomplicated: Secondary | ICD-10-CM | POA: Diagnosis not present

## 2024-02-14 DIAGNOSIS — S92534A Nondisplaced fracture of distal phalanx of right lesser toe(s), initial encounter for closed fracture: Secondary | ICD-10-CM | POA: Diagnosis not present

## 2024-02-14 DIAGNOSIS — S93401A Sprain of unspecified ligament of right ankle, initial encounter: Secondary | ICD-10-CM | POA: Diagnosis not present

## 2024-02-14 DIAGNOSIS — R030 Elevated blood-pressure reading, without diagnosis of hypertension: Secondary | ICD-10-CM | POA: Diagnosis not present

## 2024-02-14 DIAGNOSIS — Z6837 Body mass index (BMI) 37.0-37.9, adult: Secondary | ICD-10-CM | POA: Diagnosis not present

## 2024-02-19 ENCOUNTER — Ambulatory Visit (INDEPENDENT_AMBULATORY_CARE_PROVIDER_SITE_OTHER): Admitting: Podiatry

## 2024-02-19 ENCOUNTER — Ambulatory Visit (INDEPENDENT_AMBULATORY_CARE_PROVIDER_SITE_OTHER)

## 2024-02-19 ENCOUNTER — Encounter: Payer: Self-pay | Admitting: Podiatry

## 2024-02-19 DIAGNOSIS — S90121A Contusion of right lesser toe(s) without damage to nail, initial encounter: Secondary | ICD-10-CM | POA: Diagnosis not present

## 2024-02-19 DIAGNOSIS — S92501A Displaced unspecified fracture of right lesser toe(s), initial encounter for closed fracture: Secondary | ICD-10-CM

## 2024-02-19 DIAGNOSIS — S9031XA Contusion of right foot, initial encounter: Secondary | ICD-10-CM

## 2024-02-19 DIAGNOSIS — S82891A Other fracture of right lower leg, initial encounter for closed fracture: Secondary | ICD-10-CM

## 2024-02-19 DIAGNOSIS — S9000XA Contusion of unspecified ankle, initial encounter: Secondary | ICD-10-CM

## 2024-02-19 NOTE — Progress Notes (Signed)
  Subjective:  Patient ID: [name], female    DOB: [birth year],   MRN: [id number]  Chief Complaint  Patient presents with   Foot Pain    I hurt my foot last week on Tuesday.  I hit my foot on the foot board that morning.  They xrayed my foot and said I have two broke toes and they said I broke my ankle.  They put me in a boot.  They really didn't tell me what is wrong with my foot.  So, that is why I'm here.    63 y.o. female presents for concern as above.  She relates she hit the toe on the head board and has had pain in the area since then. Does have a history of radiculopathy and has pain in this foot chronically. . Denies any other pedal complaints. Denies n/v/f/c.   Past Medical History:  Diagnosis Date   Hyperlipidemia     Objective:  Physical Exam: Vascular: DP/PT pulses 2/4 bilateral. CFT <3 seconds. Normal hair growth on digits. No edema.  Skin. No lacerations or abrasions bilateral feet.  Musculoskeletal: MMT 5/5 bilateral lower extremities in DF, PF, Inversion and Eversion. Deceased ROM in DF of ankle joint. Tender to palpation about the entire foot and ankle sensitive. More pain around the lateral foot and fifth toe.  Neurological: Sensation intact to light touch.   Assessment:   1. Contusion of right foot including toes, initial encounter   2. Contusion of ankle, initial encounter      Plan:  Patient was evaluated and treated and all questions answered. -Xrays reviewed. No acute fractures or dislocations noted.  -Discussed treatement options for toe/foot contusion; risks, alternatives, and benefits explained. -Discussed taping of toe.  -Continue in CAM boot as comfortable but may used supportive shoe given history of radiculopathy.  -Recommend protection, rest, ice, elevation daily until symptoms improve -Rx pain med/anti-inflammatories as needed -Patient to return to office in 4 weeks for serial x-rays to assess healing  or sooner if condition  worsens.   Asberry Failing, DPM

## 2024-03-11 ENCOUNTER — Encounter: Payer: Self-pay | Admitting: Podiatry

## 2024-03-11 ENCOUNTER — Ambulatory Visit: Admitting: Podiatry

## 2024-03-11 DIAGNOSIS — S90121D Contusion of right lesser toe(s) without damage to nail, subsequent encounter: Secondary | ICD-10-CM | POA: Diagnosis not present

## 2024-03-11 DIAGNOSIS — S9031XD Contusion of right foot, subsequent encounter: Secondary | ICD-10-CM

## 2024-03-11 NOTE — Progress Notes (Signed)
  Subjective:  Patient ID: [name], female    DOB: 1960-11-23,   MRN: [id number]  Chief Complaint  Patient presents with   Foot Pain    Return in about 3 weeks for right foot injury.  R foot feeling 50 % better.  Having pain when walking down lateral side and across midfoot dorsal.  Wearing supportive wrap and icing. Not diabetic no anti coag    63 y.o. female presents for follow-up of right foot injury fifth toe. Relates doing about 50% better. Relates pain when walking still. She is wearing supportive wrap an icing.  . Does have a history of radiculopathy and has pain in this foot chronically. . Denies any other pedal complaints. Denies n/v/f/c.   Past Medical History:  Diagnosis Date   Hyperlipidemia     Objective:  Physical Exam: Vascular: DP/PT pulses 2/4 bilateral. CFT <3 seconds. Normal hair growth on digits. No edema.  Skin. No lacerations or abrasions bilateral feet.  Musculoskeletal: MMT 5/5 bilateral lower extremities in DF, PF, Inversion and Eversion. Deceased ROM in DF of ankle joint. Tender to palpation about the entire foot and ankle sensitive. More pain around the lateral foot and fifth toe improved.  Neurological: Sensation intact to light touch.   Assessment:   1. Contusion of right foot including toes, subsequent encounter       Plan:  Patient was evaluated and treated and all questions answered. -Xrays reviewed. No acute fractures or dislocations noted.  -Discussed treatement options for toe/foot contusion; risks, alternatives, and benefits explained. -Discussed taping of toe.  -Continue in regular shoes and wrapping as tolerated.  -Recommend protection, rest, ice, elevation daily until symptoms improve -Anti-inflammatories as needed -Patient to return to office as needed   Asberry Failing, DPM

## 2024-06-11 ENCOUNTER — Other Ambulatory Visit: Payer: Self-pay | Admitting: Family Medicine

## 2024-06-11 DIAGNOSIS — Z1231 Encounter for screening mammogram for malignant neoplasm of breast: Secondary | ICD-10-CM

## 2024-06-12 ENCOUNTER — Other Ambulatory Visit: Payer: Self-pay | Admitting: Family Medicine

## 2024-06-12 DIAGNOSIS — N644 Mastodynia: Secondary | ICD-10-CM

## 2024-07-25 ENCOUNTER — Encounter

## 2024-07-25 ENCOUNTER — Other Ambulatory Visit
